# Patient Record
Sex: Female | Born: 1985 | Hispanic: No | Marital: Married | Smoking: Former smoker
Health system: Southern US, Community
[De-identification: ages and names within clinical notes are randomized; demographics above are authoritative.]

## PROBLEM LIST (undated history)

## (undated) ENCOUNTER — Inpatient Hospital Stay (HOSPITAL_COMMUNITY): Payer: Self-pay

## (undated) DIAGNOSIS — L0390 Cellulitis, unspecified: Secondary | ICD-10-CM

## (undated) DIAGNOSIS — R87629 Unspecified abnormal cytological findings in specimens from vagina: Secondary | ICD-10-CM

## (undated) DIAGNOSIS — D649 Anemia, unspecified: Secondary | ICD-10-CM

## (undated) DIAGNOSIS — B009 Herpesviral infection, unspecified: Secondary | ICD-10-CM

## (undated) DIAGNOSIS — E049 Nontoxic goiter, unspecified: Secondary | ICD-10-CM

## (undated) DIAGNOSIS — IMO0002 Reserved for concepts with insufficient information to code with codable children: Secondary | ICD-10-CM

## (undated) HISTORY — DX: Unspecified abnormal cytological findings in specimens from vagina: R87.629

## (undated) HISTORY — DX: Nontoxic goiter, unspecified: E04.9

## (undated) HISTORY — DX: Anemia, unspecified: D64.9

## (undated) HISTORY — DX: Reserved for concepts with insufficient information to code with codable children: IMO0002

## (undated) HISTORY — DX: Herpesviral infection, unspecified: B00.9

---

## 2010-11-29 ENCOUNTER — Inpatient Hospital Stay (INDEPENDENT_AMBULATORY_CARE_PROVIDER_SITE_OTHER)
Admission: RE | Admit: 2010-11-29 | Discharge: 2010-11-29 | Disposition: A | Discharge status: Home / Self Care | Payer: Self-pay | Source: Ambulatory Visit | Attending: Emergency Medicine | Admitting: Emergency Medicine

## 2010-11-29 DIAGNOSIS — A549 Gonococcal infection, unspecified: Secondary | ICD-10-CM

## 2010-11-29 LAB — POCT URINALYSIS DIP (DEVICE)
Protein, ur: NEGATIVE mg/dL
Specific Gravity, Urine: 1.025 (ref 1.005–1.030)
Urobilinogen, UA: 1 mg/dL (ref 0.0–1.0)

## 2013-10-20 LAB — OB RESULTS CONSOLE RPR: RPR: NONREACTIVE

## 2013-10-20 LAB — OB RESULTS CONSOLE GC/CHLAMYDIA
Chlamydia: NEGATIVE
GC PROBE AMP, GENITAL: NEGATIVE

## 2013-10-20 LAB — OB RESULTS CONSOLE ABO/RH: RH Type: POSITIVE

## 2013-10-20 LAB — OB RESULTS CONSOLE RUBELLA ANTIBODY, IGM: RUBELLA: IMMUNE

## 2013-10-20 LAB — OB RESULTS CONSOLE HEPATITIS B SURFACE ANTIGEN: HEP B S AG: NEGATIVE

## 2013-10-20 LAB — OB RESULTS CONSOLE HIV ANTIBODY (ROUTINE TESTING): HIV: NONREACTIVE

## 2013-10-20 LAB — OB RESULTS CONSOLE ANTIBODY SCREEN: Antibody Screen: NEGATIVE

## 2013-11-18 ENCOUNTER — Encounter (HOSPITAL_COMMUNITY): Payer: Self-pay | Admitting: *Deleted

## 2013-11-18 ENCOUNTER — Inpatient Hospital Stay (HOSPITAL_COMMUNITY)
Admission: AD | Admit: 2013-11-18 | Discharge: 2013-11-19 | Disposition: A | Discharge status: Home / Self Care | Payer: Medicaid Other | Source: Ambulatory Visit | Attending: Obstetrics & Gynecology | Admitting: Obstetrics & Gynecology

## 2013-11-18 DIAGNOSIS — Z5320 Procedure and treatment not carried out because of patient's decision for unspecified reasons: Secondary | ICD-10-CM | POA: Insufficient documentation

## 2013-11-19 DIAGNOSIS — Z5320 Procedure and treatment not carried out because of patient's decision for unspecified reasons: Secondary | ICD-10-CM | POA: Diagnosis not present

## 2013-11-19 NOTE — MAU Note (Signed)
Not in lobby

## 2013-11-19 NOTE — MAU Note (Signed)
NOT IN LOBBY 

## 2013-11-23 LAB — OB RESULTS CONSOLE GBS: GBS: POSITIVE

## 2014-01-12 ENCOUNTER — Encounter (HOSPITAL_COMMUNITY): Payer: Self-pay | Admitting: *Deleted

## 2014-01-25 ENCOUNTER — Encounter (HOSPITAL_COMMUNITY): Payer: Self-pay

## 2014-01-25 ENCOUNTER — Inpatient Hospital Stay (HOSPITAL_COMMUNITY)
Admission: AD | Admit: 2014-01-25 | Discharge: 2014-01-25 | Disposition: A | Discharge status: Home / Self Care | Payer: Medicaid Other | Source: Ambulatory Visit | Attending: Obstetrics and Gynecology | Admitting: Obstetrics and Gynecology

## 2014-01-25 DIAGNOSIS — E669 Obesity, unspecified: Secondary | ICD-10-CM | POA: Diagnosis present

## 2014-01-25 DIAGNOSIS — S70362A Insect bite (nonvenomous), left thigh, initial encounter: Secondary | ICD-10-CM | POA: Insufficient documentation

## 2014-01-25 DIAGNOSIS — B951 Streptococcus, group B, as the cause of diseases classified elsewhere: Secondary | ICD-10-CM | POA: Diagnosis present

## 2014-01-25 DIAGNOSIS — Z3A21 21 weeks gestation of pregnancy: Secondary | ICD-10-CM | POA: Diagnosis not present

## 2014-01-25 DIAGNOSIS — O26892 Other specified pregnancy related conditions, second trimester: Secondary | ICD-10-CM | POA: Diagnosis present

## 2014-01-25 DIAGNOSIS — R6252 Short stature (child): Secondary | ICD-10-CM

## 2014-01-25 DIAGNOSIS — L0390 Cellulitis, unspecified: Secondary | ICD-10-CM | POA: Diagnosis present

## 2014-01-25 LAB — URINALYSIS, ROUTINE W REFLEX MICROSCOPIC
BILIRUBIN URINE: NEGATIVE
Glucose, UA: NEGATIVE mg/dL
Hgb urine dipstick: NEGATIVE
KETONES UR: NEGATIVE mg/dL
LEUKOCYTES UA: NEGATIVE
NITRITE: NEGATIVE
PROTEIN: NEGATIVE mg/dL
Specific Gravity, Urine: 1.015 (ref 1.005–1.030)
UROBILINOGEN UA: 0.2 mg/dL (ref 0.0–1.0)
pH: 7 (ref 5.0–8.0)

## 2014-01-25 MED ORDER — CEPHALEXIN 500 MG PO CAPS: 500.0000 mg | ORAL_CAPSULE | Freq: Four times a day (QID) | ORAL | Status: AC

## 2014-01-25 MED ORDER — IBUPROFEN 600 MG PO TABS: 600.0000 mg | ORAL_TABLET | Freq: Four times a day (QID) | ORAL | Status: DC

## 2014-01-25 NOTE — MAU Provider Note (Signed)
History   28 yo G1P0 at 4921 3/7 weeks presented unannounced c/o "bug bites" on left thigh and left 3rd finger--noted on awakening, now more swollen.  Is unsure of when contact occurred, but did not note any specific injury.  Denies fever, myalgia, N/V, or any other issues.  No hx MRSA or other skin infections.  Patient Active Problem List   Diagnosis Date Noted  . Cellulitis 01/25/2014  . Positive GBS test 01/25/2014  . Short stature 01/25/2014  . Obese 01/25/2014    Chief Complaint  Patient presents with  . Insect Bite   HPI:  See above  OB History    Gravida Para Term Preterm AB TAB SAB Ectopic Multiple Living   1               History reviewed. No pertinent past medical history.  History reviewed. No pertinent past surgical history.  History reviewed. No pertinent family history.  History  Substance Use Topics  . Smoking status: Never Smoker   . Smokeless tobacco: Not on file  . Alcohol Use: No    Allergies:  Allergies  Allergen Reactions  . Other Hives    Cayenne Pepper    No prescriptions prior to admission    ROS:  Red spot and swelling in left upper thigh, red spot and swelling of left 3rd finger. Physical Exam   Blood pressure 138/54, pulse 85, temperature 98 F (36.7 C), temperature source Oral, resp. rate 18, last menstrual period 08/28/2013.  Physical Exam  Chest clear Heart RRR without murmur Abd gravid, NT Pelvic--deferred Ext: Left upper thigh--1 cm indurated lesion, surrounded by approx 6 cm area of cellulitis.  Mild tenderness at central lesion, cellulitis area NT. Left 3rd finger--pinpoint indurated lesion above knuckle, with finger swollen and erythematous.  Finger tender to bending, but not acutely painful. No open lesion or draining wound noted.  ED Course  Assessment: IUP at 21 3/7 weeks ? Insect bite vs skin lesions at two sites.  Plan: D/C home. Thigh lesion marked circumferentially to allow for f/u evaluation. Rx Keflex 500  mg po QID x 7 days  Rx Ibuprophen 600 mg po 6 hours x 24 hours. Warm soaks to finger and thigh at least BID. RTO on 01/27/14 at 8:30am for recheck of status. Patient to call with any worsening of sx.    Nigel BridgemanLATHAM, Tykee Heideman CNM, MSN 01/25/2014 2:07 PMthop

## 2014-01-25 NOTE — MAU Note (Addendum)
Pt presents complaining of an insect bite on her left leg and left finger. States it is sore with movement. Some swelling and redness noticed at the site. States it seems to have gotten worse over night. Denies vaginal bleeding, discharge or leaking of fluid. Reports good fetal movement.

## 2014-01-25 NOTE — Discharge Instructions (Signed)

## 2014-01-25 NOTE — Progress Notes (Signed)
Notified of pt arrival in MAU and complaint. Will come see pt 

## 2014-02-16 ENCOUNTER — Other Ambulatory Visit (HOSPITAL_COMMUNITY): Payer: Self-pay | Admitting: Family Medicine

## 2014-02-16 ENCOUNTER — Other Ambulatory Visit: Payer: Self-pay | Admitting: Obstetrics and Gynecology

## 2014-02-16 ENCOUNTER — Inpatient Hospital Stay (HOSPITAL_COMMUNITY)
Admission: AD | Admit: 2014-02-16 | Discharge: 2014-02-16 | Disposition: A | Discharge status: Home / Self Care | Payer: Medicaid Other | Source: Ambulatory Visit | Attending: Obstetrics and Gynecology | Admitting: Obstetrics and Gynecology

## 2014-02-16 DIAGNOSIS — Z3A25 25 weeks gestation of pregnancy: Secondary | ICD-10-CM | POA: Diagnosis not present

## 2014-02-16 DIAGNOSIS — R9389 Abnormal findings on diagnostic imaging of other specified body structures: Secondary | ICD-10-CM

## 2014-02-16 MED ORDER — BETAMETHASONE SOD PHOS & ACET 6 (3-3) MG/ML IJ SUSP
12.0000 mg | INTRAMUSCULAR | Status: DC
  Administered 2014-02-16: 12 mg via INTRAMUSCULAR
  Filled 2014-02-16: qty 2

## 2014-02-17 ENCOUNTER — Inpatient Hospital Stay (HOSPITAL_COMMUNITY)
Admission: AD | Admit: 2014-02-17 | Discharge: 2014-02-17 | Disposition: A | Discharge status: Home / Self Care | Payer: Medicaid Other | Source: Ambulatory Visit | Attending: Obstetrics and Gynecology | Admitting: Obstetrics and Gynecology

## 2014-02-17 DIAGNOSIS — Z3A25 25 weeks gestation of pregnancy: Secondary | ICD-10-CM | POA: Diagnosis not present

## 2014-02-17 MED ORDER — BETAMETHASONE SOD PHOS & ACET 6 (3-3) MG/ML IJ SUSP
12.0000 mg | Freq: Once | INTRAMUSCULAR | Status: AC
  Administered 2014-02-17: 12 mg via INTRAMUSCULAR
  Filled 2014-02-17: qty 2

## 2014-02-17 NOTE — MAU Note (Signed)
Here for 2nd betamethasone injection.  Ice pack to LUOQ for pt comfort. (has needle issues)

## 2014-02-17 NOTE — MAU Note (Signed)
No complaints, tolerated injection

## 2014-02-20 ENCOUNTER — Ambulatory Visit (HOSPITAL_COMMUNITY)
Admit: 2014-02-20 | Discharge: 2014-02-20 | Disposition: A | Discharge status: Home / Self Care | Payer: Medicaid Other | Attending: Family Medicine | Admitting: Family Medicine

## 2014-02-20 ENCOUNTER — Ambulatory Visit (HOSPITAL_COMMUNITY): Admit: 2014-02-20 | Payer: Medicaid Other

## 2014-02-20 ENCOUNTER — Other Ambulatory Visit (HOSPITAL_COMMUNITY): Payer: Self-pay

## 2014-02-20 ENCOUNTER — Encounter (HOSPITAL_COMMUNITY): Payer: Self-pay

## 2014-02-20 ENCOUNTER — Other Ambulatory Visit (HOSPITAL_COMMUNITY): Payer: Self-pay | Admitting: Obstetrics and Gynecology

## 2014-02-20 ENCOUNTER — Other Ambulatory Visit (HOSPITAL_COMMUNITY): Payer: Self-pay | Admitting: Family Medicine

## 2014-02-20 DIAGNOSIS — O26872 Cervical shortening, second trimester: Secondary | ICD-10-CM

## 2014-02-20 DIAGNOSIS — O365920 Maternal care for other known or suspected poor fetal growth, second trimester, not applicable or unspecified: Secondary | ICD-10-CM | POA: Diagnosis present

## 2014-02-20 DIAGNOSIS — R9389 Abnormal findings on diagnostic imaging of other specified body structures: Secondary | ICD-10-CM

## 2014-02-20 DIAGNOSIS — O358XX0 Maternal care for other (suspected) fetal abnormality and damage, not applicable or unspecified: Secondary | ICD-10-CM | POA: Diagnosis not present

## 2014-02-20 DIAGNOSIS — Z3A25 25 weeks gestation of pregnancy: Secondary | ICD-10-CM

## 2014-02-20 DIAGNOSIS — IMO0002 Reserved for concepts with insufficient information to code with codable children: Secondary | ICD-10-CM

## 2014-02-20 DIAGNOSIS — Z36 Encounter for antenatal screening of mother: Secondary | ICD-10-CM | POA: Insufficient documentation

## 2014-02-20 DIAGNOSIS — Z3689 Encounter for other specified antenatal screening: Secondary | ICD-10-CM

## 2014-02-20 NOTE — Consult Note (Signed)
MFM consult    28 yr old G2P0010 at 3965w1d referred by Dr. Normand Sloopillard for fetal ultrasound and consult secondary to poor fetal growth and short cervix seen on outside ultrasound.  Ultrasound today shows: single intrauterine pregnancy. Estimated fetal weight is in the 21st%; the abdominal circumference is in the <3rd%. Anterior placenta without evidence of previa. Normal amniotic fluid volume. Shortened transvaginal cervical length of 1.7cm. The views of the face, palate, hands, and ductal arch are limited. The remainder of the limited anatomy survey is normal. Elevated S/D ratio on umbilical artery Doppler studies; there is persistent forward flow.  I counseled the patient as follows:  1. Fetal growth restriction with abdominal circumference <3rd%: - counseled on possible etiologies- placental insufficiency, infection, genetic abnormality (patient did not have aneuploidy screening) - discussed increased risk of stillbirth, need for hospitalization, need for early delivery - patient has already had a course of betamethasone on 12/7 and 12/8 - recommend repeat Doppler studies on 12/14; if stable may follow with weekly Doppler studies or more often as clinically indicated - start antenatal testing at 28 weeks with weekly BPPs; can switch to twice weekly NSTs at 32 weeks with weekly AFI - delivery timing based on clinical scenario - recommend start fetal kick counts at 28 weeks 2. Normal limited anatomy survey 3. Short cervix: - discussed increased risk of preterm labor/PPROM/ preterm delivery - patient has already been started on vaginal progesterone; recommend continue until 36 weeks - patient has already received a course of betamethasone - patient is asymptomatic - repeat cervical length on 12/14; if there is further deterioration or dilation may recommend admission to monitor for preterm labor - preterm labor precautions reviewed   I spent a total of 40 minutes with the patient of which >50%  was in face to face consultation. Please call with questions.  Eulis FosterKristen Delmar Arriaga, MD

## 2014-02-20 NOTE — Progress Notes (Signed)
[facility] ultrasound   Indication: 28 yr old G2P0010 at 2162w1d referred for fetal ultrasound secondary to poor fetal growth seen on outside ultrasound.  Findings: 1. Single intrauterine pregnancy. 2. Estimated fetal weight is in the 21st%; the abdominal circumference is in the <3rd%. 3. Anterior placenta without evidence of previa. 4. Normal amniotic fluid volume. 5. Shortened transvaginal cervical length of 1.7cm. 6. The views of the face, palate, hands, and ductal arch are limited. 7. The remainder of the limited anatomy survey is normal. 8. Elevated S/D ratio on umbilical artery Doppler studies; there is persistent forward flow.  Recommendations: 1. Fetal growth restriction with abdominal circumference <3rd%: - counseled on possible etiologies- placental insufficiency, infection, genetic abnormality (patient did not have aneuploidy screening) - discussed increased risk of stillbirth, need for hospitalization, need for early delivery - patient has already had a course of betamethasone on 12/7 and 12/8 - recommend repeat Doppler studies on 12/14; if stable may follow with weekly Doppler studies or more often as clinically indicated - start antenatal testing at 28 weeks with weekly BPPs; can switch to twice weekly NSTs at 32 weeks with weekly AFI - delivery timing based on clinical scenario - recommend start fetal kick counts at 28 weeks 2. Normal limited anatomy survey 3. Short cervix: - discussed increased risk of preterm labor/PPROM/ preterm delivery - patient has already been started on vaginal progesterone; recommend continue until 36 weeks - patient has already received a course of betamethasone - patient is asymptomatic - repeat cervical length on 12/14; if there is further deterioration or dilation may recommend admission to monitor for preterm labor - preterm labor precautions reviewed  Eulis FosterKristen Stavroula Rohde, MD

## 2014-02-23 ENCOUNTER — Inpatient Hospital Stay (HOSPITAL_COMMUNITY): Admission: RE | Admit: 2014-02-23 | Payer: Medicaid Other | Source: Ambulatory Visit

## 2014-03-10 ENCOUNTER — Other Ambulatory Visit (HOSPITAL_COMMUNITY): Payer: Self-pay | Admitting: Family Medicine

## 2014-03-10 DIAGNOSIS — IMO0002 Reserved for concepts with insufficient information to code with codable children: Secondary | ICD-10-CM

## 2014-03-13 NOTE — L&D Delivery Note (Signed)
Delivery Note At 1:28 PM a viable female, "Wendy Fernandez", was delivered via Vaginal, Spontaneous Delivery (Presentation: LOA).  APGAR: 9, 9; weight  .   Placenta status: Intact, Spontaneous.  Cord: 3 vessels with the following complications: None.  Cord pH: NA  Anesthesia: Epidural  Episiotomy: None Lacerations: Periurethral;1st degree--repaired with red robinson cath in place for urethral localization; left vaginal sidewall laceration, repaired for hemostasis. Suture Repair: 3.0 vicryl Est. Blood Loss (mL): 250  Mom to postpartum.  Baby to Couplet care / Skin to Skin. Placenta to path due to IUGR and abnormal dopplers.  Jayani Rozman 05/16/2014, 2:04 PM

## 2014-03-17 ENCOUNTER — Ambulatory Visit (HOSPITAL_COMMUNITY): Payer: Medicaid Other

## 2014-03-23 ENCOUNTER — Ambulatory Visit (HOSPITAL_COMMUNITY): Payer: Medicaid Other | Attending: Family Medicine

## 2014-03-30 ENCOUNTER — Other Ambulatory Visit (HOSPITAL_COMMUNITY): Payer: Self-pay | Admitting: Obstetrics and Gynecology

## 2014-03-30 DIAGNOSIS — O359XX0 Maternal care for (suspected) fetal abnormality and damage, unspecified, not applicable or unspecified: Secondary | ICD-10-CM

## 2014-03-30 DIAGNOSIS — O365920 Maternal care for other known or suspected poor fetal growth, second trimester, not applicable or unspecified: Secondary | ICD-10-CM

## 2014-04-13 ENCOUNTER — Encounter (HOSPITAL_COMMUNITY): Payer: Self-pay | Admitting: *Deleted

## 2014-04-13 ENCOUNTER — Inpatient Hospital Stay (HOSPITAL_COMMUNITY): Payer: Medicaid Other

## 2014-04-13 ENCOUNTER — Ambulatory Visit (HOSPITAL_COMMUNITY): Admission: RE | Admit: 2014-04-13 | Payer: Medicaid Other | Source: Ambulatory Visit

## 2014-04-13 ENCOUNTER — Inpatient Hospital Stay (HOSPITAL_COMMUNITY)
Admission: AD | Admit: 2014-04-13 | Discharge: 2014-04-13 | Disposition: A | Discharge status: Home / Self Care | Payer: Medicaid Other | Source: Ambulatory Visit | Attending: Obstetrics & Gynecology | Admitting: Obstetrics & Gynecology

## 2014-04-13 DIAGNOSIS — Z3A32 32 weeks gestation of pregnancy: Secondary | ICD-10-CM | POA: Insufficient documentation

## 2014-04-13 DIAGNOSIS — O365933 Maternal care for other known or suspected poor fetal growth, third trimester, fetus 3: Secondary | ICD-10-CM

## 2014-04-13 DIAGNOSIS — O365930 Maternal care for other known or suspected poor fetal growth, third trimester, not applicable or unspecified: Secondary | ICD-10-CM | POA: Insufficient documentation

## 2014-04-13 HISTORY — DX: Cellulitis, unspecified: L03.90

## 2014-04-13 NOTE — MAU Note (Signed)
Pt presents from the office for prolonged fetal monitoring. Denies pain, bleeding, or leaking of fluid. Reports good fetal movement.

## 2014-04-13 NOTE — Progress Notes (Signed)
MAU Addendum Note FHR 145, moderate variability, + accel no decel, occasional ctx w/uterine irribility  US results: AFI 14.48, BPP 8/8, FHR 136  Plan: Per Dr Sallye OberKulwa DC to home with instruction for fetal surveillance twice a week.  -Discussed need to follow up in office Friday at 0900 for NST & BPP  -pt to keep her MFM appointment on Monday  -Bleeding and PTL Precautions -Kick counts -Encouraged to call if any questions or concerns arise prior to next scheduled office visit.  -Discharged to home in stable condition Consulted with Dr. Burna FortsKulwa    Wendy Fernandez, CNM, MSN 04/13/2014. 2:25 PM

## 2014-04-13 NOTE — MAU Provider Note (Signed)
Wendy Fernandez is a 29 y.o. G2P0010 at 32.4 weeks.  Pt arrived from the office.  The office reports that  03/30/14 PATIENT DID NOT SHOW FOR MFM APPOINTMENT, IMPORTANCE OF MFM FOLLOW UP DISCUSSED, SHE DECLINED ANOTHER MFM APPOINTMENT TODAY STATING SHE WILL CALL MFM TO MAKE HER OWN APPOINTMENT AT HER CONVENIENCE, STATING SHE HAS A LOT ON HER PLATE TO TAKE CARE OF FIRST. -US TODAY EFW 1391 G (10TH%) (BPD 3%, HC <2%, AC 24%, FL 7%), AFI 13, SD RATIO 2.85 (NORMAL), BPP 8/8. CL 1.8 TO 2.3 CM. 04/13/14-Growth U/S and BPP reviewed: EFW 1514g (2%) decreased from previous scan 10%.  Interval growth remains stable but less than 2%, BPP 6/8 (NO BREATHING), AFI 14.3cm.  UAD: S/D elevated at 3.4.  We reviewed her reason for declining the MFM visit.  She is willing to go if she will be assigned a different nurse.  History     Patient Active Problem List   Diagnosis Date Noted  . Cellulitis 01/25/2014  . Positive GBS test 01/25/2014  . Short stature 01/25/2014  . Obese 01/25/2014    Chief Complaint  Patient presents with  . Prolonged Monitoring    HPI  OB History    Gravida Para Term Preterm AB TAB SAB Ectopic Multiple Living   2 0 0 0 1 1  0 0 0      Past Medical History  Diagnosis Date  . Cellulitis     History reviewed. No pertinent past surgical history.  History reviewed. No pertinent family history.  History  Substance Use Topics  . Smoking status: Never Smoker   . Smokeless tobacco: Not on file  . Alcohol Use: No    Allergies:  Allergies  Allergen Reactions  . Other Hives    Cayenne Pepper    Prescriptions prior to admission  Medication Sig Dispense Refill Last Dose  . Prenatal Vit-Fe Fumarate-FA (PRENATAL MULTIVITAMIN) TABS tablet Take 1 tablet by mouth daily at 12 noon.   04/13/2014 at Unknown time  . progesterone 200 MG SUPP Place 200 mg vaginally at bedtime.   04/12/2014 at Unknown time  . ibuprofen (ADVIL,MOTRIN) 600 MG tablet Take 1 tablet (600 mg total) by mouth every 6  (six) hours. For 24 hours. (Patient not taking: Reported on 04/13/2014) 30 tablet 0     ROS See HPI above, all other systems are negative  Physical Exam   Blood pressure 119/62, pulse 116, temperature 98 F (36.7 C), temperature source Oral, resp. rate 18, last menstrual period 08/28/2013.  Physical Exam Ext:  WNL ABD: Soft, non tender to palpation, no rebound or guarding SVE: deferred   ED Course  Assessment: IUP at  32.4weeks Membranes: intact FHR: Category 1 CTX:  none With the patient we spoke to MFM and she is will in to see them again.    Plan: NST 6 hour of observation BPP FU at MFM on Monday    Venus Standard, CNM, MSN 04/13/2014. 1:43 PM  NST was category 1 and reactive from the beginning, patient did not require the full 6 hour NST.  Dr. Sallye OberKulwa.

## 2014-04-13 NOTE — MAU Note (Signed)
Urine in lab 

## 2014-04-13 NOTE — Discharge Instructions (Signed)
Preterm Labor Information °Preterm labor is when labor starts at less than 37 weeks of pregnancy. The normal length of a pregnancy is 39 to 41 weeks. °CAUSES °Often, there is no identifiable underlying cause as to why a woman goes into preterm labor. One of the most common known causes of preterm labor is infection. Infections of the uterus, cervix, vagina, amniotic sac, bladder, kidney, or even the lungs (pneumonia) can cause labor to start. Other suspected causes of preterm labor include:  °· Urogenital infections, such as yeast infections and bacterial vaginosis.   °· Uterine abnormalities (uterine shape, uterine septum, fibroids, or bleeding from the placenta).   °· A cervix that has been operated on (it may fail to stay closed).   °· Malformations in the fetus.   °· Multiple gestations (twins, triplets, and so on).   °· Breakage of the amniotic sac.   °RISK FACTORS °· Having a previous history of preterm labor.   °· Having premature rupture of membranes (PROM).   °· Having a placenta that covers the opening of the cervix (placenta previa).   °· Having a placenta that separates from the uterus (placental abruption).   °· Having a cervix that is too weak to hold the fetus in the uterus (incompetent cervix).   °· Having too much fluid in the amniotic sac (polyhydramnios).   °· Taking illegal drugs or smoking while pregnant.   °· Not gaining enough weight while pregnant.   °· Being younger than 18 and older than 27 years old.   °· Having a low socioeconomic status.   °· Being African American. °SYMPTOMS °Signs and symptoms of preterm labor include:  °· Menstrual-like cramps, abdominal pain, or back pain. °· Uterine contractions that are regular, as frequent as six in an hour, regardless of their intensity (may be mild or painful). °· Contractions that start on the top of the uterus and spread down to the lower abdomen and back.   °· A sense of increased pelvic pressure.   °· A watery or bloody mucus discharge that  comes from the vagina.   °TREATMENT °Depending on the length of the pregnancy and other circumstances, your health care provider may suggest bed rest. If necessary, there are medicines that can be given to stop contractions and to mature the fetal lungs. If labor happens before 34 weeks of pregnancy, a prolonged hospital stay may be recommended. Treatment depends on the condition of both you and the fetus.  °WHAT SHOULD YOU DO IF YOU THINK YOU ARE IN PRETERM LABOR? °Call your health care provider right away. You will need to go to the hospital to get checked immediately. °HOW CAN YOU PREVENT PRETERM LABOR IN FUTURE PREGNANCIES? °You should:  °· Stop smoking if you smoke.  °· Maintain healthy weight gain and avoid chemicals and drugs that are not necessary. °· Be watchful for any type of infection. °· Inform your health care provider if you have a known history of preterm labor. °Document Released: 05/20/2003 Document Revised: 10/30/2012 Document Reviewed: 04/01/2012 °[employer]® Patient Information ©2015 [employer]. This information is not intended to replace advice given to you by your health care provider. Make sure you discuss any questions you have with your health care provider. °Fetal Movement Counts °Patient Name: __________________________________________________ Patient Due Date: ____________________ °Performing a fetal movement count is highly recommended in high-risk pregnancies, but it is good for every pregnant woman to do. Your health care provider may ask you to start counting fetal movements at 28 weeks of the pregnancy. Fetal movements often increase: °· After eating a full meal. °· After physical activity. °· After   eating or drinking something sweet or cold. °· At rest. °Pay attention to when you feel the baby is most active. This will help you notice a pattern of your baby's sleep and wake cycles and what factors contribute to an increase in fetal movement. It is important to perform a fetal  movement count at the same time each day when your baby is normally most active.  °HOW TO COUNT FETAL MOVEMENTS °· Find a quiet and comfortable area to sit or lie down on your left side. Lying on your left side provides the best blood and oxygen circulation to your baby. °· Write down the day and time on a sheet of paper or in a journal. °· Start counting kicks, flutters, swishes, rolls, or jabs in a 2-hour period. You should feel at least 10 movements within 2 hours. °· If you do not feel 10 movements in 2 hours, wait 2-3 hours and count again. Look for a change in the pattern or not enough counts in 2 hours. °SEEK MEDICAL CARE IF: °· You feel less than 10 counts in 2 hours, tried twice. °· There is no movement in over an hour. °· The pattern is changing or taking longer each day to reach 10 counts in 2 hours. °· You feel the baby is not moving as he or she usually does. °Date: ____________ Movements: ____________ Start time: ____________ Finish time: ____________  °Date: ____________ Movements: ____________ Start time: ____________ Finish time: ____________ °Date: ____________ Movements: ____________ Start time: ____________ Finish time: ____________ °Date: ____________ Movements: ____________ Start time: ____________ Finish time: ____________ °Date: ____________ Movements: ____________ Start time: ____________ Finish time: ____________ °Date: ____________ Movements: ____________ Start time: ____________ Finish time: ____________ °Date: ____________ Movements: ____________ Start time: ____________ Finish time: ____________ °Date: ____________ Movements: ____________ Start time: ____________ Finish time: ____________  °Date: ____________ Movements: ____________ Start time: ____________ Finish time: ____________ °Date: ____________ Movements: ____________ Start time: ____________ Finish time: ____________ °Date: ____________ Movements: ____________ Start time: ____________ Finish time: ____________ °Date:  ____________ Movements: ____________ Start time: ____________ Finish time: ____________ °Date: ____________ Movements: ____________ Start time: ____________ Finish time: ____________ °Date: ____________ Movements: ____________ Start time: ____________ Finish time: ____________ °Date: ____________ Movements: ____________ Start time: ____________ Finish time: ____________  °Date: ____________ Movements: ____________ Start time: ____________ Finish time: ____________ °Date: ____________ Movements: ____________ Start time: ____________ Finish time: ____________ °Date: ____________ Movements: ____________ Start time: ____________ Finish time: ____________ °Date: ____________ Movements: ____________ Start time: ____________ Finish time: ____________ °Date: ____________ Movements: ____________ Start time: ____________ Finish time: ____________ °Date: ____________ Movements: ____________ Start time: ____________ Finish time: ____________ °Date: ____________ Movements: ____________ Start time: ____________ Finish time: ____________  °Date: ____________ Movements: ____________ Start time: ____________ Finish time: ____________ °Date: ____________ Movements: ____________ Start time: ____________ Finish time: ____________ °Date: ____________ Movements: ____________ Start time: ____________ Finish time: ____________ °Date: ____________ Movements: ____________ Start time: ____________ Finish time: ____________ °Date: ____________ Movements: ____________ Start time: ____________ Finish time: ____________ °Date: ____________ Movements: ____________ Start time: ____________ Finish time: ____________ °Date: ____________ Movements: ____________ Start time: ____________ Finish time: ____________  °Date: ____________ Movements: ____________ Start time: ____________ Finish time: ____________ °Date: ____________ Movements: ____________ Start time: ____________ Finish time: ____________ °Date: ____________ Movements: ____________ Start  time: ____________ Finish time: ____________ °Date: ____________ Movements: ____________ Start time: ____________ Finish time: ____________ °Date: ____________ Movements: ____________ Start time: ____________ Finish time: ____________ °Date: ____________ Movements: ____________ Start time: ____________ Finish time: ____________ °Date: ____________ Movements: ____________ Start time: ____________ Finish time: ____________  °Date:   ____________ Movements: ____________ Start time: ____________ Finish time: ____________ °Date: ____________ Movements: ____________ Start time: ____________ Finish time: ____________ °Date: ____________ Movements: ____________ Start time: ____________ Finish time: ____________ °Date: ____________ Movements: ____________ Start time: ____________ Finish time: ____________ °Date: ____________ Movements: ____________ Start time: ____________ Finish time: ____________ °Date: ____________ Movements: ____________ Start time: ____________ Finish time: ____________ °Date: ____________ Movements: ____________ Start time: ____________ Finish time: ____________  °Date: ____________ Movements: ____________ Start time: ____________ Finish time: ____________ °Date: ____________ Movements: ____________ Start time: ____________ Finish time: ____________ °Date: ____________ Movements: ____________ Start time: ____________ Finish time: ____________ °Date: ____________ Movements: ____________ Start time: ____________ Finish time: ____________ °Date: ____________ Movements: ____________ Start time: ____________ Finish time: ____________ °Date: ____________ Movements: ____________ Start time: ____________ Finish time: ____________ °Date: ____________ Movements: ____________ Start time: ____________ Finish time: ____________  °Date: ____________ Movements: ____________ Start time: ____________ Finish time: ____________ °Date: ____________ Movements: ____________ Start time: ____________ Finish time:  ____________ °Date: ____________ Movements: ____________ Start time: ____________ Finish time: ____________ °Date: ____________ Movements: ____________ Start time: ____________ Finish time: ____________ °Date: ____________ Movements: ____________ Start time: ____________ Finish time: ____________ °Date: ____________ Movements: ____________ Start time: ____________ Finish time: ____________ °Document Released: 03/29/2006 Document Revised: 07/14/2013 Document Reviewed: 12/25/2011 °[employer]® Patient Information ©2015 [employer]. This information is not intended to replace advice given to you by your health care provider. Make sure you discuss any questions you have with your health care provider. ° °

## 2014-04-13 NOTE — MAU Note (Signed)
States she has been monitored weekly since 19 weeks due to baby small for dates. States today on BPP, there were no breathing movements. Sent for prolonged EFM.

## 2014-04-20 ENCOUNTER — Ambulatory Visit (HOSPITAL_COMMUNITY): Payer: Medicaid Other | Attending: Family Medicine

## 2014-05-01 ENCOUNTER — Ambulatory Visit (HOSPITAL_COMMUNITY)
Admission: RE | Admit: 2014-05-01 | Discharge: 2014-05-01 | Disposition: A | Discharge status: Home / Self Care | Payer: Medicaid Other | Source: Ambulatory Visit | Attending: Obstetrics and Gynecology | Admitting: Obstetrics and Gynecology

## 2014-05-01 ENCOUNTER — Other Ambulatory Visit (HOSPITAL_COMMUNITY): Payer: Self-pay | Admitting: Obstetrics and Gynecology

## 2014-05-01 ENCOUNTER — Ambulatory Visit (HOSPITAL_COMMUNITY): Payer: Medicaid Other

## 2014-05-01 ENCOUNTER — Encounter (HOSPITAL_COMMUNITY): Payer: Self-pay

## 2014-05-01 DIAGNOSIS — O365930 Maternal care for other known or suspected poor fetal growth, third trimester, not applicable or unspecified: Secondary | ICD-10-CM | POA: Insufficient documentation

## 2014-05-01 DIAGNOSIS — Z3A35 35 weeks gestation of pregnancy: Secondary | ICD-10-CM | POA: Insufficient documentation

## 2014-05-01 DIAGNOSIS — O365920 Maternal care for other known or suspected poor fetal growth, second trimester, not applicable or unspecified: Secondary | ICD-10-CM

## 2014-05-01 DIAGNOSIS — O359XX0 Maternal care for (suspected) fetal abnormality and damage, unspecified, not applicable or unspecified: Secondary | ICD-10-CM

## 2014-05-01 DIAGNOSIS — O358XX0 Maternal care for other (suspected) fetal abnormality and damage, not applicable or unspecified: Secondary | ICD-10-CM | POA: Diagnosis present

## 2014-05-01 DIAGNOSIS — O99213 Obesity complicating pregnancy, third trimester: Secondary | ICD-10-CM | POA: Insufficient documentation

## 2014-05-13 ENCOUNTER — Telehealth (HOSPITAL_COMMUNITY): Payer: Self-pay | Admitting: *Deleted

## 2014-05-13 ENCOUNTER — Encounter (HOSPITAL_COMMUNITY): Payer: Self-pay | Admitting: *Deleted

## 2014-05-13 NOTE — Telephone Encounter (Signed)
Preadmission screen  

## 2014-05-14 ENCOUNTER — Inpatient Hospital Stay (HOSPITAL_COMMUNITY)
Admission: RE | Admit: 2014-05-14 | Discharge: 2014-05-18 | DRG: 774 | Disposition: A | Discharge status: Home / Self Care | Payer: Medicaid Other | Source: Ambulatory Visit | Attending: Obstetrics & Gynecology | Admitting: Obstetrics & Gynecology

## 2014-05-14 DIAGNOSIS — O99824 Streptococcus B carrier state complicating childbirth: Secondary | ICD-10-CM | POA: Diagnosis present

## 2014-05-14 DIAGNOSIS — Z833 Family history of diabetes mellitus: Secondary | ICD-10-CM

## 2014-05-14 DIAGNOSIS — O26873 Cervical shortening, third trimester: Secondary | ICD-10-CM | POA: Diagnosis present

## 2014-05-14 DIAGNOSIS — IMO0002 Reserved for concepts with insufficient information to code with codable children: Secondary | ICD-10-CM | POA: Diagnosis present

## 2014-05-14 DIAGNOSIS — R7309 Other abnormal glucose: Secondary | ICD-10-CM | POA: Diagnosis present

## 2014-05-14 DIAGNOSIS — O9832 Other infections with a predominantly sexual mode of transmission complicating childbirth: Secondary | ICD-10-CM | POA: Diagnosis present

## 2014-05-14 DIAGNOSIS — O99214 Obesity complicating childbirth: Secondary | ICD-10-CM | POA: Diagnosis present

## 2014-05-14 DIAGNOSIS — R768 Other specified abnormal immunological findings in serum: Secondary | ICD-10-CM | POA: Diagnosis present

## 2014-05-14 DIAGNOSIS — A6000 Herpesviral infection of urogenital system, unspecified: Secondary | ICD-10-CM | POA: Diagnosis present

## 2014-05-14 DIAGNOSIS — O365930 Maternal care for other known or suspected poor fetal growth, third trimester, not applicable or unspecified: Secondary | ICD-10-CM | POA: Diagnosis present

## 2014-05-14 DIAGNOSIS — Z825 Family history of asthma and other chronic lower respiratory diseases: Secondary | ICD-10-CM

## 2014-05-14 DIAGNOSIS — N883 Incompetence of cervix uteri: Secondary | ICD-10-CM | POA: Diagnosis not present

## 2014-05-14 DIAGNOSIS — Z3A37 37 weeks gestation of pregnancy: Secondary | ICD-10-CM | POA: Diagnosis present

## 2014-05-14 DIAGNOSIS — Z8249 Family history of ischemic heart disease and other diseases of the circulatory system: Secondary | ICD-10-CM

## 2014-05-14 DIAGNOSIS — O24429 Gestational diabetes mellitus in childbirth, unspecified control: Secondary | ICD-10-CM | POA: Diagnosis present

## 2014-05-14 DIAGNOSIS — Z6841 Body Mass Index (BMI) 40.0 and over, adult: Secondary | ICD-10-CM | POA: Diagnosis not present

## 2014-05-14 DIAGNOSIS — Z87891 Personal history of nicotine dependence: Secondary | ICD-10-CM | POA: Diagnosis not present

## 2014-05-14 LAB — CBC
HCT: 34 % — ABNORMAL LOW (ref 36.0–46.0)
Hemoglobin: 11.1 g/dL — ABNORMAL LOW (ref 12.0–15.0)
MCH: 29.4 pg (ref 26.0–34.0)
MCHC: 32.6 g/dL (ref 30.0–36.0)
MCV: 90.2 fL (ref 78.0–100.0)
Platelets: 288 10*3/uL (ref 150–400)
RBC: 3.77 MIL/uL — ABNORMAL LOW (ref 3.87–5.11)
RDW: 14.3 % (ref 11.5–15.5)
WBC: 10.2 10*3/uL (ref 4.0–10.5)

## 2014-05-14 LAB — COMPREHENSIVE METABOLIC PANEL
ALBUMIN: 2.9 g/dL — AB (ref 3.5–5.2)
ALK PHOS: 138 U/L — AB (ref 39–117)
ALT: 23 U/L (ref 0–35)
AST: 21 U/L (ref 0–37)
Anion gap: 10 (ref 5–15)
BUN: 12 mg/dL (ref 6–23)
CO2: 20 mmol/L (ref 19–32)
Calcium: 9.1 mg/dL (ref 8.4–10.5)
Chloride: 106 mmol/L (ref 96–112)
Creatinine, Ser: 0.49 mg/dL — ABNORMAL LOW (ref 0.50–1.10)
GFR calc Af Amer: >90 mL/min (ref >90)
GFR calc non Af Amer: >90 mL/min (ref >90)
Glucose, Bld: 161 mg/dL — ABNORMAL HIGH (ref 70–99)
POTASSIUM: 3.8 mmol/L (ref 3.5–5.1)
Sodium: 136 mmol/L (ref 135–145)
Total Bilirubin: 0.4 mg/dL (ref 0.3–1.2)
Total Protein: 6.1 g/dL (ref 6.0–8.3)

## 2014-05-14 LAB — LACTATE DEHYDROGENASE: LDH: 122 U/L (ref 94–250)

## 2014-05-14 LAB — TYPE AND SCREEN
ABO/RH(D): A POS
ANTIBODY SCREEN: NEGATIVE

## 2014-05-14 LAB — URIC ACID: Uric Acid, Serum: 4.8 mg/dL (ref 2.4–7.0)

## 2014-05-14 MED ORDER — OXYTOCIN 40 UNITS IN LACTATED RINGERS INFUSION - SIMPLE MED
1.0000 m[IU]/min | INTRAVENOUS | Status: DC
  Filled 2014-05-14: qty 1000

## 2014-05-14 MED ORDER — OXYTOCIN BOLUS FROM INFUSION: 500.0000 mL | INTRAVENOUS | Status: DC

## 2014-05-14 MED ORDER — ONDANSETRON HCL 4 MG/2ML IJ SOLN: 4.0000 mg | Freq: Four times a day (QID) | INTRAMUSCULAR | Status: DC | PRN

## 2014-05-14 MED ORDER — ZOLPIDEM TARTRATE 5 MG PO TABS: 5.0000 mg | ORAL_TABLET | Freq: Every evening | ORAL | Status: DC | PRN

## 2014-05-14 MED ORDER — FLEET ENEMA 7-19 GM/118ML RE ENEM: 1.0000 | ENEMA | RECTAL | Status: DC | PRN

## 2014-05-14 MED ORDER — DEXTROSE 5 % IV SOLN
2.5000 10*6.[IU] | INTRAVENOUS | Status: DC
  Administered 2014-05-15 – 2014-05-16 (×3): 2.5 10*6.[IU] via INTRAVENOUS
  Filled 2014-05-14 (×11): qty 2.5

## 2014-05-14 MED ORDER — LACTATED RINGERS IV SOLN: 500.0000 mL | INTRAVENOUS | Status: DC | PRN

## 2014-05-14 MED ORDER — TERBUTALINE SULFATE 1 MG/ML IJ SOLN: 0.2500 mg | Freq: Once | INTRAMUSCULAR | Status: AC | PRN

## 2014-05-14 MED ORDER — ACETAMINOPHEN 325 MG PO TABS: 650.0000 mg | ORAL_TABLET | ORAL | Status: DC | PRN

## 2014-05-14 MED ORDER — CITRIC ACID-SODIUM CITRATE 334-500 MG/5ML PO SOLN: 30.0000 mL | ORAL | Status: DC | PRN

## 2014-05-14 MED ORDER — BUTORPHANOL TARTRATE 1 MG/ML IJ SOLN: 1.0000 mg | INTRAMUSCULAR | Status: DC | PRN

## 2014-05-14 MED ORDER — OXYTOCIN 40 UNITS IN LACTATED RINGERS INFUSION - SIMPLE MED: 62.5000 mL/h | INTRAVENOUS | Status: DC

## 2014-05-14 MED ORDER — OXYCODONE-ACETAMINOPHEN 5-325 MG PO TABS: 1.0000 | ORAL_TABLET | ORAL | Status: DC | PRN

## 2014-05-14 MED ORDER — LACTATED RINGERS IV SOLN
INTRAVENOUS | Status: DC
  Administered 2014-05-15 – 2014-05-16 (×4): via INTRAVENOUS

## 2014-05-14 MED ORDER — LIDOCAINE HCL (PF) 1 % IJ SOLN
30.0000 mL | INTRAMUSCULAR | Status: AC | PRN
  Administered 2014-05-16: 30 mL via SUBCUTANEOUS
  Filled 2014-05-14: qty 30

## 2014-05-14 MED ORDER — DEXTROSE 5 % IV SOLN
5.0000 10*6.[IU] | Freq: Once | INTRAVENOUS | Status: AC
  Administered 2014-05-15: 5 10*6.[IU] via INTRAVENOUS
  Filled 2014-05-14: qty 5

## 2014-05-14 MED ORDER — MISOPROSTOL 25 MCG QUARTER TABLET
25.0000 ug | ORAL_TABLET | ORAL | Status: DC | PRN
  Administered 2014-05-14 – 2014-05-15 (×3): 25 ug via VAGINAL
  Filled 2014-05-14: qty 0.25
  Filled 2014-05-14: qty 1
  Filled 2014-05-14 (×2): qty 0.25

## 2014-05-14 MED ORDER — OXYCODONE-ACETAMINOPHEN 5-325 MG PO TABS: 2.0000 | ORAL_TABLET | ORAL | Status: DC | PRN

## 2014-05-14 NOTE — Progress Notes (Signed)
Subjective: Comfortable, not aware of any UCs.  Ate pizza just before arrival.  Objective: BP 125/82 mmHg  Pulse 112  Temp(Src) 98.3 F (36.8 C) (Oral)  Resp 16  Ht 4\' 10"  (1.473 m)  Wt 253 lb (114.76 kg)  BMI 52.89 kg/m2  LMP 08/28/2013     FHT: Category 1 UC:   none SVE:   Deferred  Results for orders placed or performed during the hospital encounter of 05/14/14 (from the past 24 hour(s))  CBC     Status: Abnormal   Collection Time: 05/14/14  8:35 PM  Result Value Ref Range   WBC 10.2 4.0 - 10.5 K/uL   RBC 3.77 (L) 3.87 - 5.11 MIL/uL   Hemoglobin 11.1 (L) 12.0 - 15.0 g/dL   HCT 16.134.0 (L) 09.636.0 - 04.546.0 %   MCV 90.2 78.0 - 100.0 fL   MCH 29.4 26.0 - 34.0 pg   MCHC 32.6 30.0 - 36.0 g/dL   RDW 40.914.3 81.111.5 - 91.415.5 %   Platelets 288 150 - 400 K/uL  Comprehensive metabolic panel     Status: Abnormal   Collection Time: 05/14/14  8:35 PM  Result Value Ref Range   Sodium 136 135 - 145 mmol/L   Potassium 3.8 3.5 - 5.1 mmol/L   Chloride 106 96 - 112 mmol/L   CO2 20 19 - 32 mmol/L   Glucose, Bld 161 (H) 70 - 99 mg/dL   BUN 12 6 - 23 mg/dL   Creatinine, Ser 7.820.49 (L) 0.50 - 1.10 mg/dL   Calcium 9.1 8.4 - 95.610.5 mg/dL   Total Protein 6.1 6.0 - 8.3 g/dL   Albumin 2.9 (L) 3.5 - 5.2 g/dL   AST 21 0 - 37 U/L   ALT 23 0 - 35 U/L   Alkaline Phosphatase 138 (H) 39 - 117 U/L   Total Bilirubin 0.4 0.3 - 1.2 mg/dL   GFR calc non Af Amer >90 >90 mL/min   GFR calc Af Amer >90 >90 mL/min   Anion gap 10 5 - 15  Lactate dehydrogenase     Status: None   Collection Time: 05/14/14  8:35 PM  Result Value Ref Range   LDH 122 94 - 250 U/L  Uric acid     Status: None   Collection Time: 05/14/14  8:35 PM  Result Value Ref Range   Uric Acid, Serum 4.8 2.4 - 7.0 mg/dL  Type and screen     Status: None   Collection Time: 05/14/14  8:35 PM  Result Value Ref Range   ABO/RH(D) A POS    Antibody Screen NEG    Sample Expiration 05/17/2014    Hgb A1C pending.  Assessment:  IUP at 37  weeks IUGR Mildly elevated dopplers GBS positive Unfavorable cervix Possible GDM  Plan: Check CBGs q 4 hours during labor Continue observation--repeat Cytotech at 0045  Nigel BridgemanLATHAM, Ari Bernabei CNM, [place] 05/14/2014, 10:47 PM

## 2014-05-14 NOTE — H&P (Signed)
Wendy Fernandez is a 29 y.o. female, G2P0010 at 37 weeks, presenting for induction due to IUGR, abnormal dopplers at last visit.  Denies leaking, bleeding, reports +FM.  Cervix was closed, thick, vtx, -2 at last exam 05/11/14.  Patient Active Problem List   Diagnosis Date Noted  . 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 Abnormal glucose tolerance test--elevated 1 hour, refused 3 hour 05/15/2014  . Short cervix 05/15/2014  . 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 IUGR (intrauterine growth restriction) 05/14/2014  . Poor fetal growth affecting management of mother in third trimester   . Cellulitis 01/25/2014  . Positive GBS test 01/25/2014  . Short stature 01/25/2014  . Obese 01/25/2014    History of present pregnancy: Patient entered care at 9 weeks.   EDC of 06/04/14 was established by LMP and in agreement with US at 9 1/7 weeks.  Anatomy scan:  19 5/7 weeks, with growth at 17%ile, normal fluid, limited anatomy, cervix 4.24, and an anterior placenta.   Additional US evaluations:   24 4/7 weeks, for f/u anatomy:  EFW 1+7, 17%ile, HC < 2%ile, still limited anatomy of great vessels and profile, normal fluid, cervix 2.32, with funneling and shortening.   25 1/7 weeks:  EFW 1+5, 21%ile, AC < 3%ile, cervix 1.7 cm, still limited anatomy, elevated S/D ratio, AFI WNL, vtx, anterior placenta 25 4/7 weeks: Cervix 2.02, normal fluid.    02/25/14:  Vertex pres. Anterior placenta. Nl fluid. AP pposket= 4.4 cm. UA dopplers- S/D ratio= 4.34- ( mildly elevated for GA of [redacted] weeks- (50%= 3.33 for 26 weeks. No loss of diastolic flow. No flow reversal seen. Cx not measured today- appears closed by transabdominal images 03/02/14:  Vertex. Anterior placenta. Normal fluid - vertical pocket = 3.9 cm. Measured TV - small amt funneling seen. Cx appears dynamic. Umbilical artery dopplers elevated-no absent or reversed flow seen. S/D ratio=4.42. 3.33=50th% at [redacted]wks gestation. 03/09/14:  EFW  1+14, 2%ile, normal fluid, HC < 2%ile, AC 3%ile, BPD 3%ile, FL 7%ile, cervix 1.81, dopplers WNL 03/16/14:  Normal dopplers, BPP 8/8 03/23/14:  Vertex. Anterior placenta. Normal fluid. AFI of 14.6 = 50th%tile. EFW - 4th%tile. BPP 6 of 8 in 30 mins. No fetal Breathing - GA 2853w4d. Umbilical dopplers WNLs: S/D - 2.97 (50th%tile - S/D of 3.03) No loss of diastolic flow - No flow reversal. Cx funneling. Dynamic 1.66 cm - 2.1 cm. 03/30/14: Vertex. Anterior placenta. Normal fluid. AFI of 13.5 cm - 45th%tile. EFW 10th%tile. BPP 8 of 8 in 13 mins. Umbilical Dopplers: S/D = 2.85. 50th%tile =2.94. No loss of diastolic - no flow reversal. Cx closed. Mildly dynamic - 1.8 cm - 2.3 cm Measured TV. 04/13/14:  EFW 3+5, 2%ile, normal fluid, HC <2%ile, BPD 20%ile, AC <2%ile, FL 3%ile.  Elevated S/D ratio, no ADEF/reverse flow, cervix closed with funneling/dynamic, 1.25-1.51 04/17/14:  BPP 8/8, UA dopplers slightly elevated, normal fluid. 04/24/14:  BPP 8/8, dopplers unchanged, normal fluid 05/01/14 at MFM: EFW 3+12, <10%ile, AC < 3%ile, AFI 14.2, 51%ile, vtx,  UA dopplers upper normal range.   05/04/14:  Normal fluid, BPP 6/8, mildly elevated dopplers, vtx 05/11/14:  AFI 13.8, BPP 8/8, mildly elevated dopplers, vtx, no AEDF/reverse flow.  Significant prenatal events:  Had first trimester spotting, with normal US.  Thyroid studies done due to enlarged thyroid, WNL.  Declined genetic testing.  Declined flu vaccine and TDAP.  Seen in [facility] 01/25/14 with insect bite vs skin lesion--on Keflex and Ibuprophen.  US showed growth lag and  shortened/funneled cervix at 25 weeks--betamethasone course given and vag progesterone begun, with weekly cervical evaluation by [place].  F/U US showed persistent growth lag, with patient referred to MFM.  Mildly elevated dopplers also noted.  Recommendations made for antenatal testing/serial dopplers/BPPs, serial [place] for growth, and induction at 37 weeks.  Had elevated 1 hour GTT (194), with patient refusing 3 hour  GTT.  Also had LGSIL noted on pap 01/2014, with recommendation for colpo, but patient declined, stating she had been told no colpo would be done during pregnancy.   She was placed on Fe supplement for mild anemia.  She declined NSTs at 33 1/7 weeks and at 35 1/7 weeks.  She had toxo titers done at 04/13/14 due to IUGR and small HC--+HSV 2 by titer, with Valtrex begun then. Last evaluation:  05/11/14:  Cervix closed, long, vtx, -2.  Normal BPP and fluid, dopplers mildly elevated, no AEDF or reversal.  Scheduled for induction 05/14/14.  OB History    Gravida Para Term Preterm AB TAB SAB Ectopic Multiple Living   2 0 0 0 1 1  0 0 0    2009--TAB at 13 weeks  Past Medical History  Diagnosis Date  . Cellulitis   . Anemia   . Vaginal Pap smear, abnormal   . Herpes   . Enlarged thyroid   . IUGR (intrauterine growth restriction)    Surgical Hx:  D&C 2009 for TAB at 13 weeks  Family History: family history includes Asthma in her brother; Cancer in her maternal aunt and paternal grandmother; Depression in her brother; Diabetes in her maternal grandfather; Heart disease in her maternal grandmother; Hypertension in her father, maternal grandfather, and mother; Kidney disease in her maternal grandfather; Schizophrenia in her maternal grandmother. There is no history of Arthritis or Alcohol abuse.   Social History:  reports that she quit smoking about 3 years ago. She does not have any smokeless tobacco history on file. She reports that she does not drink alcohol or use illicit drugs.  Patient is African American, of the [place] faith, 4 yr college educated, employed as Museum/gallery conservator.  FOB is involved and supportive.     Prenatal Transfer Tool  Maternal Diabetes: Suspected--elevated 1 hour GTT (195), refused 3 hour GTT.  CBG 161 on admission, Hgb A1C pending from admission Genetic Screening: Declined Maternal Ultrasounds/Referrals: Abnormal:  Findings:   IUGR, elevated dopplers Fetal Ultrasounds or  other Referrals:  Referred to Materal Fetal Medicine due to IUGR and short cervix at 25 weeks. Maternal Substance Abuse:  No Significant Maternal Medications:  Meds include: Other: Received betamethasone course at 25 weeks Significant Maternal Lab Results: Lab values include: Group B Strep positive  TDAP declined Flu declined  ROS:  +FM  Allergies  Allergen Reactions  . Other Hives    Cayenne Pepper     Dilation: 1 Station: -1 Exam by:: Manfred Arch, CNM Blood pressure 125/82, pulse 112, temperature 98.3 F (36.8 C), temperature source Oral, resp. rate 16, height  (1.473 m), weight 253 lb (114.76 kg), last menstrual period 08/28/2013.  Chest clear Heart RRR without murmur Abd gravid, NT, FH 38 cm Pelvic: Posterior, 1 cm, 50%, vtx, -1 No HSV lesions noted on external vulva, in vagina, or on cervix Ext: WNL  FHR: Category 1 UCs:  None  Prenatal labs: ABO, Rh: --/--/A POS (03/03 2035) Antibody: NEG (03/03 2035) Rubella:   Immune RPR: Nonreactive (08/10 0000)  HBsAg: Negative (08/10 0000)  HIV: Non-reactive (08/10 0000)  GBS: Positive (09/13 0000) in urine Sickle cell/Hgb electrophoresis:  AA Pap:  01/13/14--LGSIL, colpo planned but not done during pregnancy GC:  Negative 10/30/13 Chlamydia:  Negative 10/30/13 Genetic screenings:  Declined Glucola:  Elevated at 195, refused 3 hour GTT Other:   Hgb 12.4 at NOB, 10.8 at 28 weeks Torch titers +HSV 2 TSH, T4 WNL    Assessment/Plan: IUP at 37 1/7 weeks--for induction IUGR with elevated dopplers GBS positive Elevated 1 hour GTT, no 3 hour GTT--suspect GDM BMI 55.4 HSV 2--no recent/current lesions  Plan: Admit to Birthing Suite per consult with Dr. Estanislado Pandy Routine [facility] orders Plan cytotech for cervical ripening, then pitocin Patient planning unmedicated labor--encouraged to keep options open Hgb A1C on admit labs, with CMP, LDH, uric acid If initial serum glucose and/or Hgb A1C elevated, will check CBGs q 4  hours. Start GBS prophylaxis with onset of pitocin or active labor. Pain med/epidural prn  Tylena Prisk, VICKICNM, [place] 05/15/2014, 8:52p

## 2014-05-15 ENCOUNTER — Inpatient Hospital Stay (HOSPITAL_COMMUNITY): Payer: Medicaid Other | Admitting: Anesthesiology

## 2014-05-15 ENCOUNTER — Encounter (HOSPITAL_COMMUNITY): Payer: Self-pay

## 2014-05-15 DIAGNOSIS — R768 Other specified abnormal immunological findings in serum: Secondary | ICD-10-CM | POA: Diagnosis present

## 2014-05-15 DIAGNOSIS — N883 Incompetence of cervix uteri: Secondary | ICD-10-CM | POA: Diagnosis not present

## 2014-05-15 DIAGNOSIS — R7309 Other abnormal glucose: Secondary | ICD-10-CM | POA: Diagnosis present

## 2014-05-15 DIAGNOSIS — IMO0002 Reserved for concepts with insufficient information to code with codable children: Secondary | ICD-10-CM | POA: Diagnosis not present

## 2014-05-15 LAB — GLUCOSE, CAPILLARY
GLUCOSE-CAPILLARY: 102 mg/dL — AB (ref 70–99)
GLUCOSE-CAPILLARY: 89 mg/dL (ref 70–99)
GLUCOSE-CAPILLARY: 97 mg/dL (ref 70–99)
Glucose-Capillary: 104 mg/dL — ABNORMAL HIGH (ref 70–99)
Glucose-Capillary: 82 mg/dL (ref 70–99)
Glucose-Capillary: 70 mg/dL (ref 70–99)

## 2014-05-15 LAB — ABO/RH: ABO/RH(D): A POS

## 2014-05-15 MED ORDER — FENTANYL 2.5 MCG/ML BUPIVACAINE 1/10 % EPIDURAL INFUSION (WH - ANES)
14.0000 mL/h | INTRAMUSCULAR | Status: DC | PRN
  Administered 2014-05-15 – 2014-05-16 (×3): 14 mL/h via EPIDURAL
  Filled 2014-05-15 (×3): qty 125

## 2014-05-15 MED ORDER — PHENYLEPHRINE 40 MCG/ML (10ML) SYRINGE FOR IV PUSH (FOR BLOOD PRESSURE SUPPORT)
PREFILLED_SYRINGE | INTRAVENOUS | Status: AC
  Filled 2014-05-15: qty 20

## 2014-05-15 MED ORDER — FENTANYL 2.5 MCG/ML BUPIVACAINE 1/10 % EPIDURAL INFUSION (WH - ANES)
INTRAMUSCULAR | Status: DC | PRN
  Administered 2014-05-15: 11 mL/h via EPIDURAL

## 2014-05-15 MED ORDER — PHENYLEPHRINE 40 MCG/ML (10ML) SYRINGE FOR IV PUSH (FOR BLOOD PRESSURE SUPPORT)
80.0000 ug | PREFILLED_SYRINGE | INTRAVENOUS | Status: DC | PRN
  Filled 2014-05-15: qty 2

## 2014-05-15 MED ORDER — EPHEDRINE 5 MG/ML INJ
10.0000 mg | INTRAVENOUS | Status: DC | PRN
  Filled 2014-05-15: qty 2

## 2014-05-15 MED ORDER — EPHEDRINE 5 MG/ML INJ
10.0000 mg | INTRAVENOUS | Status: DC | PRN
  Filled 2014-05-15: qty 4
  Filled 2014-05-15: qty 2

## 2014-05-15 MED ORDER — OXYTOCIN 40 UNITS IN LACTATED RINGERS INFUSION - SIMPLE MED
1.0000 m[IU]/min | INTRAVENOUS | Status: DC
  Administered 2014-05-15: 2 m[IU]/min via INTRAVENOUS

## 2014-05-15 MED ORDER — TERBUTALINE SULFATE 1 MG/ML IJ SOLN: 0.2500 mg | Freq: Once | INTRAMUSCULAR | Status: AC | PRN

## 2014-05-15 MED ORDER — LACTATED RINGERS IV SOLN
500.0000 mL | Freq: Once | INTRAVENOUS | Status: AC
  Administered 2014-05-15: 500 mL via INTRAVENOUS

## 2014-05-15 MED ORDER — LIDOCAINE HCL (PF) 1 % IJ SOLN
INTRAMUSCULAR | Status: DC | PRN
  Administered 2014-05-15: 4 mL
  Administered 2014-05-15: 3 mL

## 2014-05-15 MED ORDER — PHENYLEPHRINE 40 MCG/ML (10ML) SYRINGE FOR IV PUSH (FOR BLOOD PRESSURE SUPPORT)
80.0000 ug | PREFILLED_SYRINGE | INTRAVENOUS | Status: DC | PRN
  Filled 2014-05-15: qty 20
  Filled 2014-05-15: qty 2

## 2014-05-15 MED ORDER — FENTANYL 2.5 MCG/ML BUPIVACAINE 1/10 % EPIDURAL INFUSION (WH - ANES)
INTRAMUSCULAR | Status: AC
  Administered 2014-05-15: 14 mL/h via EPIDURAL
  Filled 2014-05-15: qty 125

## 2014-05-15 MED ORDER — DIPHENHYDRAMINE HCL 50 MG/ML IJ SOLN: 12.5000 mg | INTRAMUSCULAR | Status: DC | PRN

## 2014-05-15 NOTE — Anesthesia Procedure Notes (Addendum)
Epidural Patient location during procedure: OB Start time: 05/15/2014 2:46 PM  Staffing Anesthesiologist: FOSTER, MICHAEL A. Performed by: anesthesiologist   Preanesthetic Checklist Completed: patient identified, site marked, surgical consent, pre-op evaluation, timeout performed, IV checked, risks and benefits discussed and monitors and equipment checked  Epidural Patient position: sitting Prep: site prepped and draped and DuraPrep Patient monitoring: continuous pulse ox and blood pressure Approach: midline Location: L3-L4 Injection technique: LOR air  Needle:  Needle type: Tuohy  Needle gauge: 17 G Needle length: 9 cm and 9 Needle insertion depth: 5 cm and 10 cm Catheter type: closed end flexible Catheter size: 19 Gauge Catheter at skin depth: 15 cm Test dose: negative and Other  Assessment Events: blood not aspirated, injection not painful, no injection resistance, negative IV test and no paresthesia  Additional Notes Patient identified. Risks and benefits discussed including failed block, incomplete  Pain control, post dural puncture headache, nerve damage, paralysis, blood pressure Changes, nausea, vomiting, reactions to medications-both toxic and allergic and post Partum back pain. All questions were answered. Patient expressed understanding and wished to proceed. Sterile technique was used throughout procedure. Epidural site was Dressed with sterile barrier dressing. No paresthesias, signs of intravascular injection Or signs of intrathecal spread were encountered.  Patient was more comfortable after the epidural was dosed. Please see RN's note for documentation of vital signs and FHR which are stable.   Epidural Patient location during procedure: OB Start time: 05/16/2014 8:50 AM End time: 05/16/2014 9:02 AM  Staffing Anesthesiologist: Rosario JacksPOTISEK, Sakiyah Shur GUFFEY Performed by: anesthesiologist   Preanesthetic Checklist Completed: patient identified, site marked,  surgical consent, pre-op evaluation, timeout performed, IV checked, risks and benefits discussed and monitors and equipment checked  Epidural Patient position: sitting Prep: site prepped and draped and DuraPrep Patient monitoring: continuous pulse ox and blood pressure Approach: midline Location: L3-L4 Injection technique: LOR saline  Needle:  Needle type: Tuohy  Needle gauge: 17 G Needle length: 9 cm and 9 Catheter type: closed end flexible Catheter size: 19 Gauge Catheter at skin depth: 15 cm Test dose: negative  Assessment Events: blood not aspirated, injection not painful, no injection resistance, negative IV test and no paresthesia  Additional Notes Epidural replaced due to inadequate levels.  Discussed replacement w/ patient.  Single pass, one level.  LOR at 7.5 cm, catheter threaded to 15cm.  No complications.

## 2014-05-15 NOTE — Progress Notes (Signed)
Labor Progress  Subjective: Sitting in the recliner, starting to be uncomfortable with each ctx, breathing thru each one  Objective: BP 124/75 mmHg  Pulse 70  Temp(Src) 97.9 F (36.6 C) (Oral)  Resp 12  Ht 4\' 10"  (1.473 m)  Wt 253 lb (114.76 kg)  BMI 52.89 kg/m2  LMP 08/28/2013     FHT:140, moderate variability, + accel, no decel  CTX:  regular, every 2-3 minutes Uterus gravid, soft non tender SVE:  Dilation: 1 Effacement (%): 50 Station: -1 Exam by:: Daniel Johndrow, CNM Pitocin at 2910mUn/min  Assessment:  IUP at 37.1 weeks NICHD: Category 1 Membranes:  intact Labor progress: IOL Pitocin Augmentation GBS: positive Foley bulb placed   Plan: Continue labor plan Continuous monitoring Frequent position changes to facilitate fetal rotation and descent. Will reassess with cervical exam at 1700 or earlier if necessary Hold pitocin at current rate      Edmonia Gonser, CNM, MSN 05/15/2014. 1:03 PM

## 2014-05-15 NOTE — Progress Notes (Signed)
Labor Progress  Subjective: Over all comfortable, uncomfortable with VE  Objective: BP 119/45 mmHg  Pulse 94  Temp(Src) 98.3 F (36.8 C) (Oral)  Resp 16  Ht 4\' 10"  (1.473 m)  Wt 253 lb (114.76 kg)  BMI 52.89 kg/m2  LMP 08/28/2013     FHT: 140 moderate variability, + accel, no decel CTX:  regular, every 3-4 minutes Uterus gravid, soft non tender SVE:  Dilation: 1 Effacement (%): 50 Station: -1 Exam by:: Kerstyn Coryell, CNM   Assessment:  IUP at 37.1 weeks NICHD: Category 1 Membranes: intact Labor progress: IOL Cytotec x 3 WUJ:[id number]:positive A1C pending A1GDM: stable sugars 102 at 0500   Plan: Continue labor plan Continuous monitoring Ambulate Frequent position changes to facilitate fetal rotation and descent. Will reassess with cervical exam at 1200 or earlier if necessary Start pitocin per protocol      Elaf Clauson, CNM, MSN 05/15/2014. 9:08 AM

## 2014-05-15 NOTE — Progress Notes (Addendum)
  Subjective: Drowsy, occasional nap.  FOB at bedside.  Objective:   VSS, afebrile.    FHT: Category 1 UC:   None SVE:   Dilation: 1 Effacement (%): 50 Station: -1 Exam by:: Vl[place], CNM  Cervix very posterior 2nd dose of Cytotech placed in posterior fornix.  Assessment:  Induction for IUGR, mildly elevated dopplers GBS positive   Plan: Continue cervical ripening.  Nigel BridgemanLATHAM, Avante Carneiro CNM 05/15/2014, 0030

## 2014-05-15 NOTE — Progress Notes (Signed)
Labor Progress  Subjective: Comfortable, no complaints  Objective: BP 140/90 mmHg  Pulse 69  Temp(Src) 98.7 F (37.1 C) (Oral)  Resp 16  Ht 4\' 10"  (1.473 m)  Wt 253 lb (114.76 kg)  BMI 52.89 kg/m2  SpO2 98%  LMP 08/28/2013   Total I/O In: -  Out: 280 [Urine:280]   FHT: 140, moderate variability, + accel, no decel CTX:  regular, every 2-3 minutes Uterus gravid, soft non tender SVE:  4/60/-1 Pitocin at 7210mUn/min  Assessment:  IUP at 37.1 weeks NICHD: Category 1 Membranes:  intact Labor progress: IOL Pitocin Augmentation GBS: positive A1GDM: stable sugars  A1C pending Foley bulb fell out at 1745   Plan: Continue labor plan Continuous monitoring Rest Frequent position changes to facilitate fetal rotation and descent. Continue pitocin per protocol     Kreig Parson, CNM, MSN 05/15/2014. 6:22 PM

## 2014-05-15 NOTE — Anesthesia Preprocedure Evaluation (Signed)
Anesthesia Evaluation  Patient identified by MRN, date of birth, ID band Patient awake    Reviewed: Allergy & Precautions, Patient's Chart, lab work & pertinent test results  Airway Mallampati: III  TM Distance: >3 FB Neck ROM: Full    Dental no notable dental hx. (+) Teeth Intact   Pulmonary former smoker,  breath sounds clear to auscultation  Pulmonary exam normal       Cardiovascular negative cardio ROS  Rhythm:Regular Rate:Normal     Neuro/Psych negative neurological ROS  negative psych ROS   GI/Hepatic Neg liver ROS, GERD-  Medicated and Controlled,  Endo/Other  negative endocrine ROS  Renal/GU negative Renal ROS  negative genitourinary   Musculoskeletal negative musculoskeletal ROS (+)   Abdominal (+) + obese,   Peds  Hematology  (+) anemia ,   Anesthesia Other Findings   Reproductive/Obstetrics (+) Pregnancy                             Anesthesia Physical Anesthesia Plan  ASA: III  Anesthesia Plan: Epidural   Post-op Pain Management:    Induction:   Airway Management Planned: Natural Airway  Additional Equipment:   Intra-op Plan:   Post-operative Plan:   Informed Consent: I have reviewed the patients History and Physical, chart, labs and discussed the procedure including the risks, benefits and alternatives for the proposed anesthesia with the patient or authorized representative who has indicated his/her understanding and acceptance.     Plan Discussed with: Anesthesiologist  Anesthesia Plan Comments:         Anesthesia Quick Evaluation

## 2014-05-15 NOTE — Progress Notes (Signed)
  Subjective: Has slept at intervals, denies UCs.  Objective: BP 125/82 mmHg  Pulse 112  Temp(Src) 98.3 F (36.8 C) (Oral)  Resp 16  Ht 4\' 10"  (1.473 m)  Wt 253 lb (114.76 kg)  BMI 52.89 kg/m2  LMP 08/28/2013    CBG (last 3)   Recent Labs  05/15/14 0035 05/15/14 0502  GLUCAP 97 102*       FHT: Category 1 UC:   Minimal SVE:   Dilation: 1 Station: -1 Exam by:: Wendy Fernandez, CNM  Cervix very posterior. 3rd Cytotech dose placed in posterior fornix  Assessment:  Induction for IUGR GBS positive  Plan: Continue cervical ripening. Hgb A1C still pending.  Wendy Fernandez, Wendy Fernandez CNM 05/15/2014, 4:44 AM

## 2014-05-15 NOTE — Progress Notes (Signed)
Wendy Fernandez MRN: [id number]  Subjective: -Care Assumed of 28y.o. G2P0 at 37.1wks who presents for IOL d/t IUGR and elevated dopplers.  Patient resting in bed, states that "I just feel like I need to lay down."  Patient reports lower abdominal pressure.  FOB and his father at bedside.   Objective: BP 128/66 mmHg  Pulse 80  Temp(Src) 98.4 F (36.9 C) (Oral)  Resp 18  Ht 4\' 10"  (1.473 m)  Wt 253 lb (114.76 kg)  BMI 52.89 kg/m2  SpO2 98%  LMP 08/28/2013 I/O last 3 completed shifts: In: -  Out: 580 [Urine:580]   FHT: 155 bpm, Mod Var, -Decels, +Accels UC:  Q1-752min, palpates mild  SVE:   Dilation: 4 Effacement (%): 50 Station: -1 Exam by:: Gerrit HeckJessica Aleksa Collinsworth Membranes: Intact Pitocin: 5815mUn/min  Assessment:  IUP at 37.1wks Cat I FT  IUGR IOL Pitocin Titration  Plan: -Discussed R/B of amniotomy, patient declines at current -Continue pitocin titration and position changes -Continue other mgmt as ordered  [name],MSN, CNM 05/15/2014, 8:28 PM

## 2014-05-16 ENCOUNTER — Encounter (HOSPITAL_COMMUNITY): Payer: Self-pay

## 2014-05-16 LAB — HEMOGLOBIN A1C
Hgb A1c MFr Bld: 5.5 % (ref 4.8–5.6)
Mean Plasma Glucose: 111 mg/dL

## 2014-05-16 LAB — GLUCOSE, CAPILLARY
GLUCOSE-CAPILLARY: 107 mg/dL — AB (ref 70–99)
Glucose-Capillary: 114 mg/dL — ABNORMAL HIGH (ref 70–99)

## 2014-05-16 LAB — RPR: RPR: NONREACTIVE

## 2014-05-16 LAB — HIV ANTIBODY (ROUTINE TESTING W REFLEX): HIV Screen 4th Generation wRfx: NONREACTIVE

## 2014-05-16 MED ORDER — ONDANSETRON HCL 4 MG PO TABS: 4.0000 mg | ORAL_TABLET | ORAL | Status: DC | PRN

## 2014-05-16 MED ORDER — PRENATAL MULTIVITAMIN CH
1.0000 | ORAL_TABLET | Freq: Every day | ORAL | Status: DC
  Administered 2014-05-17 – 2014-05-18 (×2): 1 via ORAL
  Filled 2014-05-16 (×2): qty 1

## 2014-05-16 MED ORDER — DIBUCAINE 1 % RE OINT: 1.0000 "application " | TOPICAL_OINTMENT | RECTAL | Status: DC | PRN

## 2014-05-16 MED ORDER — SIMETHICONE 80 MG PO CHEW: 80.0000 mg | CHEWABLE_TABLET | ORAL | Status: DC | PRN

## 2014-05-16 MED ORDER — LIDOCAINE-EPINEPHRINE (PF) 2 %-1:200000 IJ SOLN
INTRAMUSCULAR | Status: DC | PRN
  Administered 2014-05-16 (×2): 5 mL via INTRADERMAL

## 2014-05-16 MED ORDER — ZOLPIDEM TARTRATE 5 MG PO TABS
5.0000 mg | ORAL_TABLET | Freq: Every evening | ORAL | Status: DC | PRN
Start: 2014-05-16 — End: 2014-05-18

## 2014-05-16 MED ORDER — LANOLIN HYDROUS EX OINT
TOPICAL_OINTMENT | CUTANEOUS | Status: DC | PRN
Start: 2014-05-16 — End: 2014-05-18

## 2014-05-16 MED ORDER — TETANUS-DIPHTH-ACELL PERTUSSIS 5-2.5-18.5 LF-MCG/0.5 IM SUSP: 0.5000 mL | Freq: Once | INTRAMUSCULAR | Status: DC

## 2014-05-16 MED ORDER — IBUPROFEN 600 MG PO TABS
600.0000 mg | ORAL_TABLET | Freq: Four times a day (QID) | ORAL | Status: DC
  Administered 2014-05-16 – 2014-05-18 (×8): 600 mg via ORAL
  Filled 2014-05-16 (×8): qty 1

## 2014-05-16 MED ORDER — OXYCODONE-ACETAMINOPHEN 5-325 MG PO TABS: 2.0000 | ORAL_TABLET | ORAL | Status: DC | PRN

## 2014-05-16 MED ORDER — BENZOCAINE-MENTHOL 20-0.5 % EX AERO
1.0000 "application " | INHALATION_SPRAY | CUTANEOUS | Status: DC | PRN
  Administered 2014-05-16: 1 via TOPICAL
  Filled 2014-05-16: qty 56

## 2014-05-16 MED ORDER — OXYCODONE-ACETAMINOPHEN 5-325 MG PO TABS: 1.0000 | ORAL_TABLET | ORAL | Status: DC | PRN

## 2014-05-16 MED ORDER — DIPHENHYDRAMINE HCL 25 MG PO CAPS: 25.0000 mg | ORAL_CAPSULE | Freq: Four times a day (QID) | ORAL | Status: DC | PRN

## 2014-05-16 MED ORDER — WITCH HAZEL-GLYCERIN EX PADS: 1.0000 "application " | MEDICATED_PAD | CUTANEOUS | Status: DC | PRN

## 2014-05-16 MED ORDER — SENNOSIDES-DOCUSATE SODIUM 8.6-50 MG PO TABS
2.0000 | ORAL_TABLET | ORAL | Status: DC
  Filled 2014-05-16 (×2): qty 2

## 2014-05-16 MED ORDER — ONDANSETRON HCL 4 MG/2ML IJ SOLN: 4.0000 mg | INTRAMUSCULAR | Status: DC | PRN

## 2014-05-16 NOTE — Progress Notes (Signed)
  Subjective: Sleeping s/p epidural replacement.  Objective: BP 139/80 mmHg  Pulse 105  Temp(Src) 98.6 F (37 C) (Oral)  Resp 18  Ht 4\' 10"  (1.473 m)  Wt 253 lb (114.76 kg)  BMI 52.89 kg/m2  SpO2 98%  LMP 08/28/2013 I/O last 3 completed shifts: In: -  Out: 1350 [Urine:1350] Total I/O In: -  Out: 500 [Urine:500]  FHT: Category 1 UC:   regular, every 3 minutes SVE:   Deferred at present Pitocin at 12 mu/min MVUs now adequate since 8am--190-205  Assessment:  Induction for IUGR GBS positive Early labor Adequate labor  Plan: Continue to observe, continue pitocin augmentation to maintain adequacy. Re-evaluate prn after appropriate time of adequate labor.  Nigel BridgemanLATHAM, Libia Fazzini CNM 05/16/2014, 10:22 AM

## 2014-05-16 NOTE — Progress Notes (Signed)
  Subjective: Lower abdominal pain with UCs.  Denies pressure.  Objective: BP 150/102 mmHg  Pulse 86  Temp(Src) 98 F (36.7 C) (Oral)  Resp 18  Ht 4\' 10"  (1.473 m)  Wt 253 lb (114.76 kg)  BMI 52.89 kg/m2  SpO2 98%  LMP 08/28/2013 I/O last 3 completed shifts: In: -  Out: 1350 [Urine:1350]      FHT: Category 1 UC:   regular, every 2-3 minutes SVE:   Dilation: 4.5 Effacement (%): 50 Station: -1 Exam by:: Holli Rengel, cnm --cervix unchanged from exam at 0530 MVUs 150-170 Pitocin at 12 mu/min  Assessment:  Early labor Inadequate MVUs  Plan: Continue to work with epidural for comfort. Continue augmentation to establish/maintain adequacy of labor.  Nigel BridgemanLATHAM, Sebastien Jackson CNM 05/16/2014, 7:47 AM

## 2014-05-16 NOTE — Lactation Note (Signed)
This note was copied from the chart of Wendy Fabian SharpDaquita Wedin. Lactation Consultation Note Initial visit at 8 hours of age.  Mom reports baby has done a 5 minute feeding and she feels baby got something.  Baby is 7544w2d and 5#2oz.  Baby asleep on mom post bath and STS.  Peds here to assess baby.  Attempted hand expression not a drop visible.  Baby showing feeding cues,  Baby to breast.  Assisted with cross cradle hold, baby latched with assist for a few strong sucks and then stopped and back to sleep.  Re visited after several minutes with DEBP for mom and baby was with visitor so mom could use the bathroom and had a low temp so baby is back with mom STS.  Mom needs to begin pumping and plans to after STS and baby is stable.  Encouraged mom to use preemie setting for 15 minutes followed by hand expression every 3 hours/8 times in 24hours.  Set up instructions and cleaning discussed.  Visitor at bedside supportive.  [facility] resources given and discussed.  Encouraged to feed with early cues on demand.  Early newborn behavior discussed.  Hand expression done by mom with instructions, no colostrum visible.  Mom plans to offer baby alimentum by spoon if she is not collecting EBM.  Green LPI education sheet given and discussed as low birth weight applies to this baby. Mom receptive to teaching.   Mom to call for assist as needed.    Patient Name: Wendy Fernandez OZHYQ'MToday's Date: 05/16/2014 Reason for consult: Initial assessment   Maternal Data Has patient been taught Hand Expression?: Yes Does the patient have breastfeeding experience prior to this delivery?: No  Feeding Feeding Type: Breast Fed Length of feed:  (few sucks)  LATCH Score/Interventions Latch: Repeated attempts needed to sustain latch, nipple held in mouth throughout feeding, stimulation needed to elicit sucking reflex. Intervention(s): Skin to skin;Teach feeding cues;Waking techniques Intervention(s): Breast compression  Audible Swallowing:  None Intervention(s): Skin to skin;Hand expression  Type of Nipple: Everted at rest and after stimulation Intervention(s): Hand pump;Double electric pump  Comfort (Breast/Nipple): Soft / non-tender     Hold (Positioning): Assistance needed to correctly position infant at breast and maintain latch. Intervention(s): Breastfeeding basics reviewed;Support Pillows;Position options;Skin to skin  LATCH Score: 6  Lactation Tools Discussed/Used WIC Program: No Pump Review: Setup, frequency, and cleaning Initiated by:: JS Date initiated:: 05/16/14   Consult Status Consult Status: Follow-up Date: 05/17/14 Follow-up type: In-patient    Beverely RisenShoptaw, Arvella MerlesJana Lynn 05/16/2014, 9:53 PM

## 2014-05-16 NOTE — Progress Notes (Signed)
Fabian SharpDaquita Jersey MRN: [id number]  Subjective: -Patient resting in bed.  Requests usage of bedpan.  Voiding without issues and reports that bladder is emptying during each void.    Objective: BP 133/62 mmHg  Pulse 82  Temp(Src) 99 F (37.2 C) (Axillary)  Resp 18  Ht 4\' 10"  (1.473 m)  Wt 253 lb (114.76 kg)  BMI 52.89 kg/m2  SpO2 98%  LMP 08/28/2013 I/O last 3 completed shifts: In: -  Out: 580 [Urine:580] Total I/O In: -  Out: 300 [Urine:300] FHT: 135 bpm, Mod Var, -Decels, +Accels UC: Occasional, palpates mild   SVE:   Dilation: 4 Effacement (%): 50 Station: -1 Exam by:: Gerrit HeckJessica Asyia Hornung CNM Membranes: Intact Pitocin: 4122mUn/min  Assessment:  IUP at 37.2wks Cat I FT  IUGR IOL Pitocin Titration Epidural  Plan: -Discussed POC to include amniotomy and IUPC insertion or temporary discontinue pitocin and reassess in 2 hours -Patient reports fear of "something happening," with AROM and wants provider to assure her that nothing will happen and she won't end up with a c/s -Provider able to provide realistic approach in that no guarantees can be made regarding delivery outcomes, but assurances made that goal is for healthy baby and healthy mom -Patient further educated on amniotomy and benefits during IOL process -Questions and concerns further addressed -Discontinue pitocin and plan to reassess in 2 hours -Continue other mgmt as ordered  Kelce Bouton LYNN,MSN, CNM 05/16/2014, 3:43 AM

## 2014-05-16 NOTE — Progress Notes (Signed)
  Subjective: Feeling lots of pressure.  No pain with UCs, just pressure.  Objective: BP 163/109 mmHg  Pulse 92  Temp(Src) 98.6 F (37 C) (Oral)  Resp 18  Ht 4\' 10"  (1.473 m)  Wt 253 lb (114.76 kg)  BMI 52.89 kg/m2  SpO2 98%  LMP 08/28/2013 I/O last 3 completed shifts: In: -  Out: 1350 [Urine:1350] Total I/O In: -  Out: 500 [Urine:500]   Filed Vitals:   05/16/14 0930 05/16/14 0950 05/16/14 1000 05/16/14 1030  BP: 127/91 139/80 140/100 163/109  Pulse: 99 105 101 92  Temp:  98.6 F (37 C)    TempSrc:      Resp:  18    Height:      Weight:      SpO2:        FHT: Category 2-- mild variables with pushing, moderate variability UC:   regular, every 4-5 minutes SVE: Complete, vtx +1.   Pitocin at 12 mu/min MVUs 230  Assessment:  2nd stage labor IUGR  Plan: Will initiate active pushing and observe status--if no progress, will defer pushing for laboring down if patient able.  Nigel BridgemanLATHAM, Nemiah Bubar CNM 05/16/2014, 11:11 AM

## 2014-05-16 NOTE — Progress Notes (Signed)
Wendy SharpDaquita Fernandez MRN: [id number]  Subjective: -Patient reports anxiety regarding amniotomy and IUPC insertion.   Objective: BP 123/44 mmHg  Pulse 83  Temp(Src) 99 F (37.2 C) (Axillary)  Resp 18  Ht 4\' 10"  (1.473 m)  Wt 253 lb (114.76 kg)  BMI 52.89 kg/m2  SpO2 98%  LMP 08/28/2013 I/O last 3 completed shifts: In: -  Out: 580 [Urine:580] Total I/O In: -  Out: 370 [Urine:370] FHT: 130 bpm, Mod Var, -Decels, +Accels UC:  None graphed or palpated  SVE:   Dilation: 4 Effacement (%): 50 Station: -1 Exam by:: Gerrit HeckJessica Araya Roel CNM Membranes: AROM of moderate amt, clear fluid at 0530 Pitocin: Restart at 86mUn/min  Assessment:  IUP at 37.2wks Cat I FT  IUGR IOL Amniotomy GBS Positive  Plan: -Questions and concerns addressed, reassurances given regarding amniotomy and iupc -Patient verbalizes understanding and agrees with plan -Restart pitocin at 6mUn and titrate based on MVUs -Continue other mgmt as ordered -Will report off to oncoming provider, Manfred ArchV. Latham, CNM  Graesyn Schreifels [facility] LLCYNN,MSN, CNM 05/16/2014, 5:35 AM

## 2014-05-17 LAB — CBC
HCT: 29.3 % — ABNORMAL LOW (ref 36.0–46.0)
HEMOGLOBIN: 9.7 g/dL — AB (ref 12.0–15.0)
MCH: 29.9 pg (ref 26.0–34.0)
MCHC: 33.1 g/dL (ref 30.0–36.0)
MCV: 90.4 fL (ref 78.0–100.0)
PLATELETS: 228 10*3/uL (ref 150–400)
RBC: 3.24 MIL/uL — AB (ref 3.87–5.11)
RDW: 14.4 % (ref 11.5–15.5)
WBC: 14.8 10*3/uL — ABNORMAL HIGH (ref 4.0–10.5)

## 2014-05-17 MED ORDER — FERROUS SULFATE 325 (65 FE) MG PO TABS
325.0000 mg | ORAL_TABLET | Freq: Two times a day (BID) | ORAL | Status: DC
  Administered 2014-05-17 – 2014-05-18 (×2): 325 mg via ORAL
  Filled 2014-05-17 (×2): qty 1

## 2014-05-17 NOTE — Progress Notes (Signed)
Wendy Fernandez   Subjective: Post Partum Day 1 Vaginal delivery, 1 degree laceration Patient up ad lib, denies syncope or dizziness. Reports consuming regular diet without issues and denies N/V No issues with urination and reports bleeding is appropriate  Feeding:  Breast/bottle Contraceptive plan:   none  Objective: Temp:  [98.1 F (36.7 C)-98.6 F (37 C)] 98.1 F (36.7 C) (03/06 0430) Pulse Rate:  [71-149] 83 (03/06 0430) Resp:  [16-20] 16 (03/06 0430) BP: (110-148)/(54-87) 110/54 mmHg (03/06 0430) SpO2:  [100 %] 100 % (03/06 0430)  Physical Exam:  General: alert and cooperative Ext: WNL, no edema. No evidence of DVT seen on physical exam. Breast: Soft filling Lungs: CTAB Heart RRR without murmur  Abdomen:  Soft, fundus firm, lochia scant, + bowel sounds, non distended, non tender Lochia: appropriate Uterine Fundus: firm Laceration: healing well    Recent Labs  05/14/14 2035 05/17/14 0555  HGB 11.1* 9.7*  HCT 34.0* 29.3*    Assessment S/P Vaginal Delivery-Day 1 Stable  Normal Involution Breastfeeding/Bottlefeeding   Plan: Continue current care Plan for discharge tomorrow, Breastfeeding and Lactation consult Iron supplement   Carine Nordgren, CNM, MSN 05/17/2014, 11:20 AM

## 2014-05-17 NOTE — Lactation Note (Signed)
This note was copied from the chart of Wendy Fabian SharpDaquita Kinnard. Lactation Consultation Note  Patient Name: Wendy Fernandez ZOXWR'UToday's Date: 05/17/2014 Reason for consult: Follow-up assessment LC followed up with Mom to assist with breastfeeding. Mom reports RN helped her and is now giving baby a bottle. Advised Mom to call if she wants LC assist.  Maternal Data    Feeding    LATCH Score/Interventions                      Lactation Tools Discussed/Used     Consult Status Consult Status: PRN Date: 05/18/14 Follow-up type: In-patient    Wendy Fernandez, Wendy Fernandez 05/17/2014, 9:10 PM

## 2014-05-17 NOTE — Lactation Note (Signed)
This note was copied from the chart of [name] Wasko. Lactation Consultation Note  Patient Name: [name] Spang ZOXWR'UToday's Date: 05/17/2014 Reason for consult: Follow-up assessment [facility], when LC came to see Mom she had given baby a bottle. Mom reports baby is only staying on the breast 2-3 minutes so she thinks baby is not getting any milk so she gives her a bottle. Some basic teaching reviewed. LC left phone number for MOm to call with next feeding for assist.  Maternal Data    Feeding    LATCH Score/Interventions                      Lactation Tools Discussed/Used     Consult Status Consult Status: Follow-up Date: 05/17/14 Follow-up type: In-patient    Alfred LevinsGranger, Dontray Haberland Ann 05/17/2014, 9:09 PM

## 2014-05-17 NOTE — Anesthesia Postprocedure Evaluation (Signed)
Anesthesia Post Note  Patient: Wendy SharpDaquita Courter  Procedure(s) Performed: * No procedures listed *  Anesthesia type: Epidural  Patient location: Mother/Baby  Post pain: Pain level controlled  Post assessment: Post-op Vital signs reviewed  Last Vitals:  Filed Vitals:   05/17/14 0430  BP: 110/54  Pulse: 83  Temp: 36.7 C  Resp: 16    Post vital signs: Reviewed  Level of consciousness:alert  Complications: No apparent anesthesia complications

## 2014-05-18 MED ORDER — IBUPROFEN 600 MG PO TABS: 600.0000 mg | ORAL_TABLET | Freq: Four times a day (QID) | ORAL | Status: AC | PRN

## 2014-05-18 MED ORDER — FERROUS SULFATE 325 (65 FE) MG PO TABS
325.0000 mg | ORAL_TABLET | Freq: Every day | ORAL | Status: AC
Start: 2014-05-18 — End: ?

## 2014-05-18 NOTE — Discharge Summary (Signed)
Vaginal Delivery Discharge Summary  Wendy Fernandez Wendy Fernandez  DOB:    10/18/1985 MRN:    [id number] CSN:    [id number]  Date of admission:                  05/14/14  Date of discharge:                   05/18/14  Procedures this admission:   Induction of labor, SVB, repair of 1st degree periurethral laceration  Date of Delivery: 05/16/14  Newborn Data:  Live born female  Birth Weight: 5 lb 2 oz (2325 g) APGAR: 9, 9  Home with mother. Name: Wendy Fernandez   History of Present Illness:  Ms. Wendy Fernandez is a 29 y.o. female, G2P1011, who presents at 3243w2d weeks gestation. [facility]. She was admitted induction of labor for IUGR, abnormal dopplers. Her pregnancy has been complicated by:  Patient Active Problem List   Diagnosis Date Noted  . Vaginal delivery 05/16/2014  . 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 Abnormal glucose tolerance test--elevated 1 hour, refused 3 hour 05/15/2014  . Short cervix 05/15/2014  . 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 IUGR (intrauterine growth restriction) 05/14/2014  . Poor fetal growth affecting management of mother in third trimester   . Cellulitis 01/25/2014  . Positive GBS test 01/25/2014  . Short stature 01/25/2014  . Obese 01/25/2014     Hospital Course:  Admitted 05/14/14 for induction due to IUGR and abnormal dopplers. Positive GBS. Progressed with [employer] then pitocin . Utilized epidural for pain management.  Delivery was performed by Nigel BridgemanVicki Virgil Lightner, CNM, without complication. Patient and baby tolerated the procedure without difficulty, with 1st degree periurethral and left vaginal sidewall lacerations noted. Infant status was stable and remained in room with mother.  Mother and infant then had an uncomplicated postpartum course, with breast and bottle feeding going well. Mom's physical exam was WNL, and she was discharged  home in stable condition. Contraception plan per patient was "to take precautions--declined any prescriptive contraceptives..  She received adequate benefit from po pain medications, using Motrin.  Also started on Fe due to Hgb of 9.7, with no syncope or dizziness.   Feeding:  breast and bottle  Contraception:  "Will take precautions" per patient.  Discharge hemoglobin:  HEMOGLOBIN  Date Value Ref Range Status  05/17/2014 9.7* 12.0 - 15.0 g/dL Final   HCT  Date Value Ref Range Status  05/17/2014 29.3* 36.0 - 46.0 % Final    Discharge Physical Exam:   General: alert Lochia: appropriate Uterine Fundus: firm Incision: healing well DVT Evaluation: No evidence of DVT seen on physical exam. Negative Homan's sign.  Intrapartum Procedures: spontaneous vaginal delivery Postpartum Procedures: none Complications-Operative and Postpartum: 1st degree periurethral  Discharge Diagnoses: Term Pregnancy-delivered and IUGR, GBS positive, anemia  Discharge Information:  Activity:           pelvic rest Diet:                routine Medications: Ibuprofen and Iron Condition:      stable Instructions:     Discharge to: home  Follow-up Information    Follow up with [facility] In 6 weeks.   Specialty:  Obstetrics and Gynecology   Why:  Call for any questions or concerns.   Contact information:   [address] [place] [id number]  [phone]       Nigel Bridgeman CNM 05/18/2014 9:07 AM

## 2014-05-18 NOTE — Lactation Note (Signed)
This note was copied from the chart of Wendy Fernandez. Lactation Consultation Note Baby was rooting when I entered the room.  She reports that she had just breast fed and had also given formula.  She agreed to latch the baby.  Latch was shallow.  I asked permission to touch her breast in an effort to show her how to compress the areola when it was in the baby's mouth and encouraged the baby to take more.  I briefly showed her how to do this.  The baby took a little more into her mouth but quickly fell asleep.  Mom fed her formula. I reviewed frequency of pump with mom and encouraged her that her milk would come to volume between 3 - 5 days.  Aware of support group and outpatient services.  Patient Name: Wendy Fabian SharpDaquita Karges VWUJW'JToday's Date: 05/18/2014 Reason for consult: Follow-up assessment   Maternal Data    Feeding Feeding Type: Breast Fed Length of feed: 2 min  LATCH Score/Interventions Latch: Repeated attempts needed to sustain latch, nipple held in mouth throughout feeding, stimulation needed to elicit sucking reflex.  Audible Swallowing: None  Type of Nipple: Everted at rest and after stimulation  Comfort (Breast/Nipple): Soft / non-tender     Hold (Positioning): No assistance needed to correctly position infant at breast.  LATCH Score: 7  Lactation Tools Discussed/Used     Consult Status      Soyla DryerJoseph, Niley Helbig 05/18/2014, 11:04 AM

## 2014-05-18 NOTE — Discharge Instructions (Signed)

## 2014-05-19 ENCOUNTER — Ambulatory Visit (HOSPITAL_COMMUNITY)
Admission: RE | Admit: 2014-05-19 | Discharge: 2014-05-19 | Disposition: A | Discharge status: Home / Self Care | Payer: Medicaid Other | Source: Ambulatory Visit | Attending: Obstetrics & Gynecology | Admitting: Obstetrics & Gynecology

## 2014-05-19 NOTE — Progress Notes (Signed)
UR chart review completed.  

## 2014-05-19 NOTE — Lactation Note (Signed)
Lactation Consult  Mother's reason for visit: Latch assist Visit Type: Feeding assessment Appointment Notes: None Consult:  Initial Lactation Consultant:  Huston FoleyMOULDEN, Olukemi Panchal S  ________________________________________________________________________   Baby's Name: Wendy Fernandez Date of Birth: 05/16/2012 Pediatrician: Donnie Coffinubin Gender: female Gestational Age: 7356w2d (At Birth) Birth Weight: 5 lb 2 oz (2325 g) Weight at Discharge:  Weight: (!) 5 lb 0.4 oz (2280 g) Date of Discharge: 05/18/2014 Filed Weights   05/17/14 0024 05/18/14 0031 05/18/14 1030  Weight: 5 lb 1.5 oz (2310 g) 5 lb 0.1 oz (2270 g) 5 lb 0.4 oz (2280 g)   [facility]: 5-1today Location:Pediatrician's office Weight today: 5-0.4     ________________________________________________________________________  Mother's Name: Wendy Fernandez Type of delivery:  Vaginal Breastfeeding Experience:  First baby Maternal Medical Conditions:  None per patient Maternal Medications:  Ibuprofen, PNV'S, IRON  ________________________________________________________________________  Breastfeeding History (Post Discharge)  Frequency of breastfeeding:  Every 2-3 hours Duration of feeding:  15-20 minutes  Supplementation  Formula:  Volume 30ml Frequency:  Every 3 hours        Brand: Similac    Method:  Bottle,   Pumping  Type of pump:  Manual (patient has a pump in style but has not used yet) Frequency:  Three times per day Volume:  0ml  Infant Intake and Output Assessment  Voids:  7 in 24 hrs.  Color:  Clear yellow Stools:  3 in 24 hrs.  Color:  Yellow  ________________________________________________________________________  Maternal Breast Assessment  Breast:  Filling Nipple:  Erect Pain level:  7 at home/0 here using 20 mm nipple shield Pain interventions:   Bra  _______________________________________________________________________ Feeding Assessment/Evaluation  Mom and 3 day old baby her for feeding assist.  Mom states she needs help latching because feedings are very painful.  Both nipples intact.  Assisted with positioning baby in football hold.  Parents both shown how to use good breast compression to assist with deep latch.  Baby is very sleepy and showing very little interest in latching to breast.  20 mm nipple shield applied and baby latched easily and began to actively suck.  Baby nursed for 15 minutes on both breasts.  When baby came off a drop of milk seen and shield but no milk transfer.  I feel breasts are just beginning to fill.  Mom disappointed.  Reassured her that milk is just starting to come in.  Mom was very comfortable with feeding.  Plan written for mom to breastfeed baby with any feeding cue but at least every 3 hours, post pump both breasts after feedings for 15 minutes, supplement baby after breast with 30-45 mls of expressed milk and or formula as needed.  Follow up appointment scheduled for Friday 05/22/14.  Initial feeding assessment:  Infant's oral assessment:  WNL  Positioning:  Football Right breast/left breast  LATCH documentation:  Latch:  2 = Grasps breast easily, tongue down, lips flanged, rhythmical sucking.  Audible swallowing:  1 = A few with stimulation  Type of nipple:  2 = Everted at rest and after stimulation  Comfort (Breast/Nipple):  1 = Filling, red/small blisters or bruises, mild/mod discomfort  Hold (Positioning):  1 = Assistance needed to correctly position infant at breast and maintain latch  LATCH score:  7  Attached assessment:  Deep  Lips flanged:  Yes.    Lips untucked:  No.  Suck assessment:  Displays both  Tools:  Nipple shield 20 mm Instructed on use and cleaning  of tool:  Yes.    Pre-feed weight:  2280 g  Post-feed weight:  2280 g Amount transferred:  0 ml Amount supplemented:  35  ml      Total amount transferred:  0 ml Total supplement given:  35ml

## 2014-05-22 ENCOUNTER — Ambulatory Visit (HOSPITAL_COMMUNITY): Payer: Medicaid Other

## 2014-05-22 ENCOUNTER — Ambulatory Visit (HOSPITAL_COMMUNITY): Admission: RE | Admit: 2014-05-22 | Payer: Medicaid Other | Source: Ambulatory Visit

## 2014-05-28 ENCOUNTER — Ambulatory Visit (HOSPITAL_COMMUNITY)
Admission: RE | Admit: 2014-05-28 | Discharge: 2014-05-28 | Disposition: A | Discharge status: Home / Self Care | Payer: 59 | Source: Ambulatory Visit | Attending: Obstetrics & Gynecology | Admitting: Obstetrics & Gynecology

## 2014-05-28 NOTE — Lactation Note (Signed)
Lactation Consult  Mother's reason for visit:  F/U now that my milk is in  Visit Type: Feeding assessment  Appointment Notes:  Confirmed  Consult:  Follow-Up Lactation Consultant:  Wendy Fernandez  ________________________________________________________________________ Wendy FloresBaby's Name: Wendy Fernandez Date of Birth: 05/16/2012 Pediatrician:Dr. Maryellen Pileavid Rubin  Gender: female Gestational Age: [redacted]w[redacted]d (At Birth) Birth Weight: 5 lb 2 oz (2325 g) Weight at Discharge:  Weight: (!) 5 lb 0.4 oz (2280 g) Date of Discharge: 05/18/2014 Filed Weights   05/17/14 0024 05/18/14 0031 05/18/14 1030  Weight: 5 lb 1.5 oz (2310 g) 5 lb 0.1 oz (2270 g) 5 lb 0.4 oz (2280 g)   [facility]: 5-1 oz on 3/11  Location:[facility] Weight today:2424 g , 5-5.5 oz      ________________________________________________________________________  Mother's Name: Wendy Fernandez Type of delivery:  Vaginal  Breastfeeding Experience: Sore nipples , improving per mom , left is still alittle sore  Maternal Medical Conditions:  None  Maternal Medications:  PNV , Iron   ________________________________________________________________________  Breastfeeding History (Post Discharge)  Frequency of breastfeeding:  Every 2-3 hours  Duration of feeding:  30 mins to 60 mins and if still hungry supplement or I feed her until she falls asleep   Supplementing: per mom supplementing with Alimentum  30 ml after feedings ( mentioned to mom to increase ) ( SEE PLAN BELOW )   Infant Intake and Output Assessment  Voids:  8 n 24 hrs.  Color:  Clear yellow Stools:  3-4 in 24 hrs Yellow  ________________________________________________________________________  Maternal Breast Assessment  Breast:  Soft Nipple:  Erect Pain level:  0 Pain interventions:  Expressed breast  milk  _______________________________________________________________________ Feeding Assessment/Evaluation  Initial feeding assessment: Per  Mom Fed baby at the breast for 10 mins 1 hour prior to apt.   Infant's oral assessment:  WNL  Positioning:  Cross cradle Right breast  LATCH documentation: ( latched without the nipple shield   Latch:  2 = Grasps breast easily, tongue down, lips flanged, rhythmical sucking.  Audible swallowing:  2 = Spontaneous and intermittent  Type of nipple:  2 = Everted at rest and after stimulation  Comfort (Breast/Nipple):  2 = Soft / non-tender  Hold (Positioning):  1 = Assistance needed to correctly position infant at breast and maintain latch  LATCH score: 9   Attached assessment:  Deep  Lips flanged:  Yes.    Lips untucked:  Yes.    Suck assessment:  Nutritive  Tools:  None at this latch  Instructed on use and cleaning of tool:  No.  Pre-feed weight:  2424 g , 5-5.5 oz  Post-feed weight:  2448 g , 5-6.3 oz  Amount transferred:  24 ml  Amount supplemented: none   Additional Feeding Assessment -   Infant's oral assessment:  WNL  Positioning:  Cross cradle Right breast   LATCH documentation: used a #20 NS   Latch:  1 = Repeated attempts needed to sustain latch, nipple held in mouth throughout feeding, stimulation needed to elicit sucking reflex.  Audible swallowing:  1 = A few with stimulation  Type of nipple:  2 = Everted at rest and after stimulation  Comfort (Breast/Nipple):  2 = Soft / non-tender  Hold (Positioning):  1 = Assistance needed to correctly position infant at breast and maintain latch  LATCH score: 7   Attached assessment:  Shallow  Lips flanged:  Yes.    Lips untucked:  Yes.  Suck assessment:  Nonnutritive  Tools:  Nipple shield 20 mm Instructed on use and cleaning of tool:  Yes.   On the 2nd breast fed less than 5 mins and feel asleep ,  Baby  been fed an hour prior to consult and probably wasn't ready for a  big meal    Total amount pumped post feed: did not post pump   Total amount transferred:  24 ml  Total supplement given:  None  Lactation Impression:  Mom presented with Baby at consult  Baby was woken up to feed  Per mom  fed her an hour prior to consult  Mom is working hard with feeding at the breast with a NS and doing extra pumping  ( per  Mom 4 -6 times a day but doesn't get much , best has been 30 ml )  Mom aware the the baby needs consistent calories and to limit time at the breast and watch for hanging out. ( non - nutritive sucking )   LC recommended F/U LC visit or attending the BFSG group for weight checks and mom declined scheduling the LC O/P apt , will consider the BFSG ( per mom )    Lactation Plan of Care:  Praised mom for her efforts  Feedings - continue to feed every 2 1/2 -3 hours and on demand  Feed 1st breast 15 - 20  mins max , offer 2nd breast , and supplement after feeding with 30- 45 ml  Before applying nipple shield , hand express, and roll nipple Extra pumping - after 5-6 feedings a day for 15 -20 mins both breast together  Consider Mother's Love Herb supplement - follow recommended dose  Oatmeal - Steal Cut Oats  And Lactation [employer]

## 2014-07-10 ENCOUNTER — Other Ambulatory Visit: Payer: Self-pay | Admitting: Obstetrics and Gynecology

## 2016-06-26 IMAGING — US US FETAL BPP W/O NONSTRESS
1 series · 13 of 17 positions shown · non-contrast
Comparison: none

[Series 1: us fetal bpp w/o nonstress · non-contrast · 17 acquisitions, 13 frames shown]
[im 1/17]
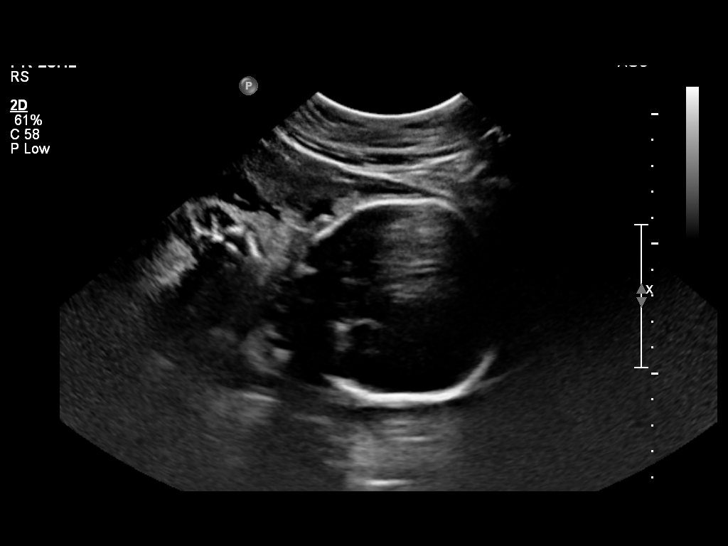
[im 2/17]
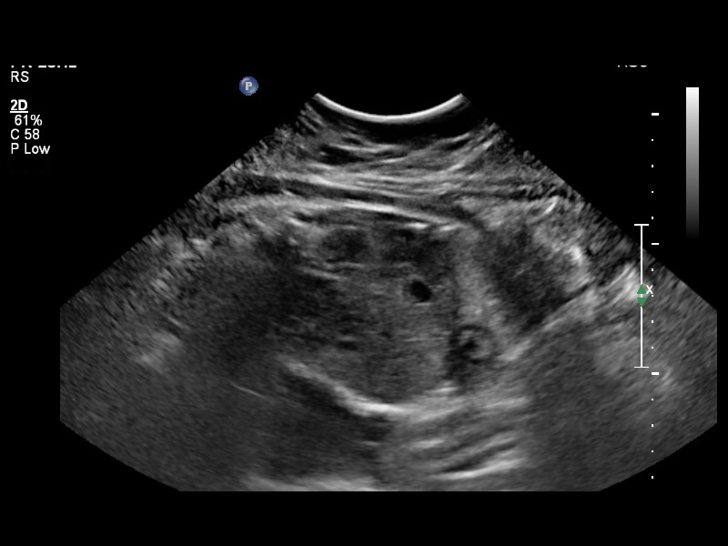
[im 4/17]
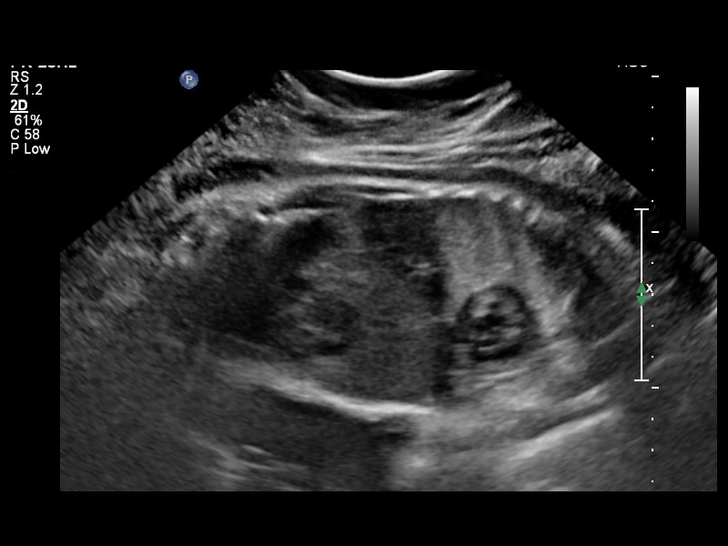
[im 5/17]
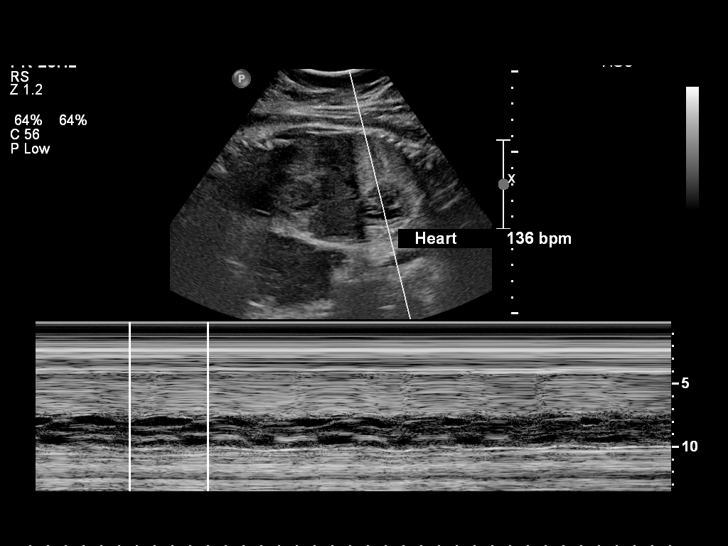
[im 6/17]
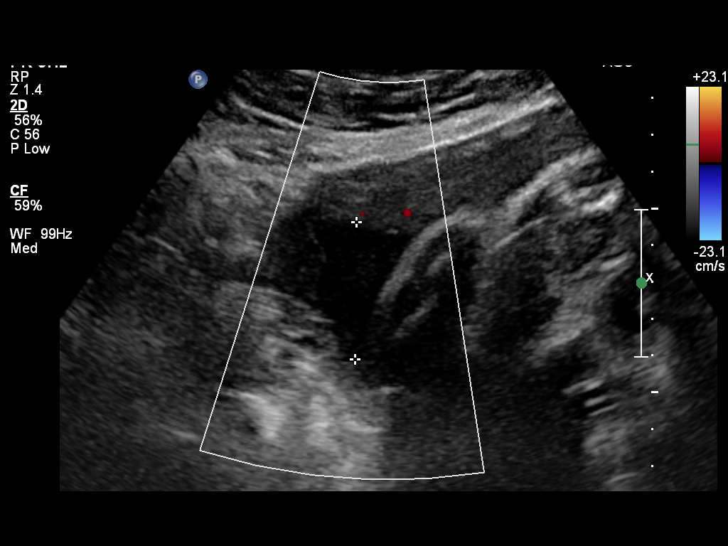
[im 8/17]
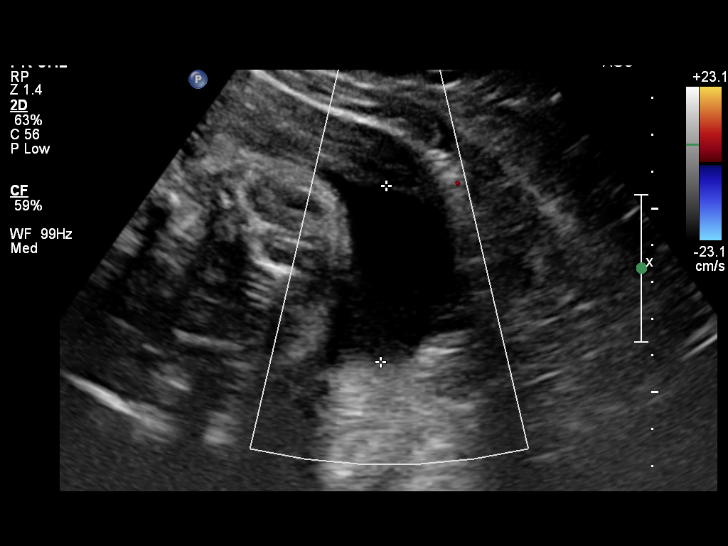
[im 9/17]
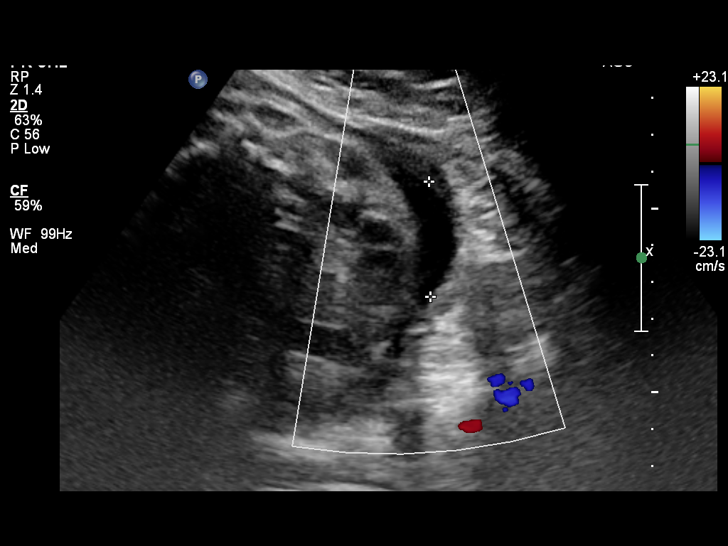
[im 10/17]
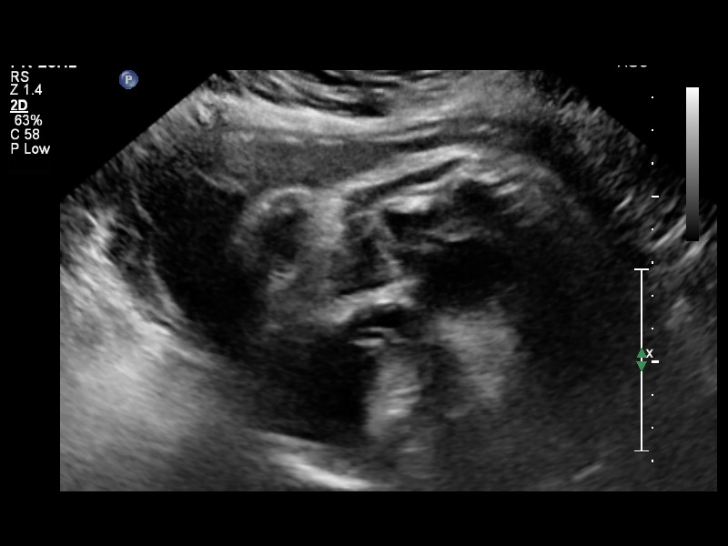
[im 12/17]
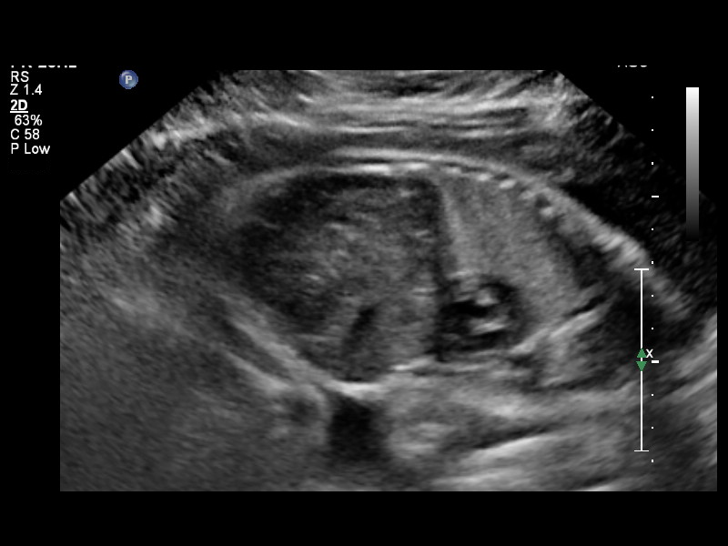
[im 13/17]
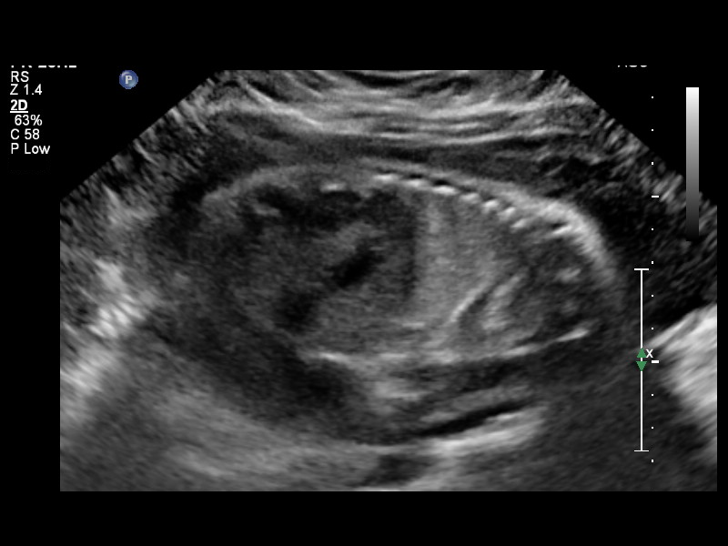
[im 14/17]
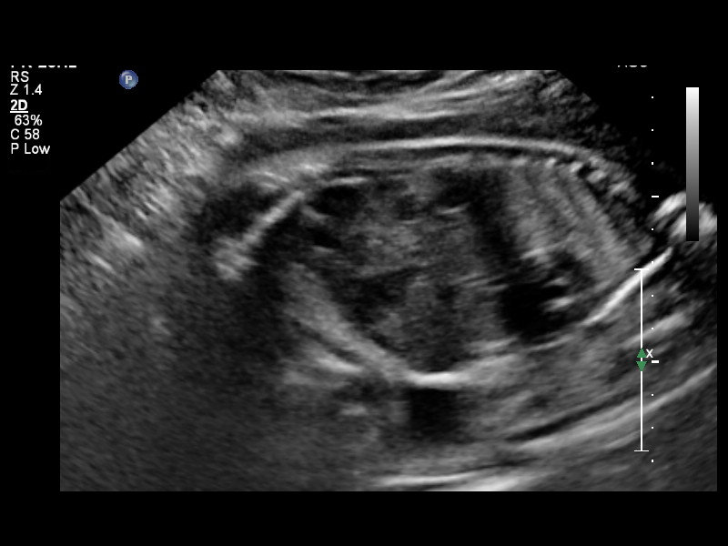
[im 16/17]
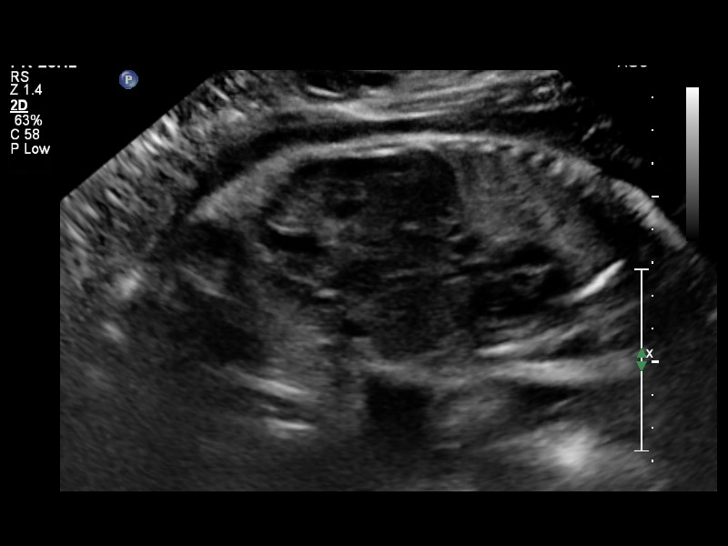
[im 17/17]
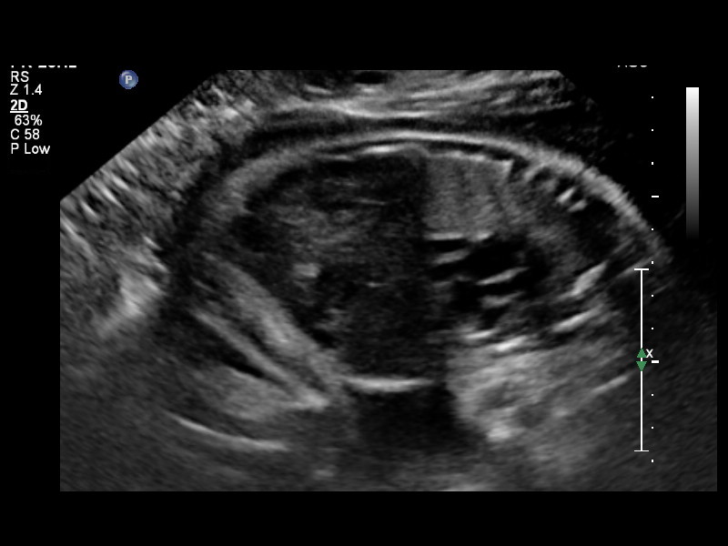

[13 of 17 positions shown; findings below may reference images not displayed]

OBSTETRICS REPORT

Service(s) Provided

Indications

 Size less than dates (Small for gestational age,
 FGR)
 Fetal abnormality - other known or suspected
 (small HC)
 Maternal care for known of suspected poor fetal
 growth, second trimester, not applicable or
 unspecified
 32 weeks gestation of pregnancy
Fetal Evaluation

 Num Of Fetuses:    1
 Fetal Heart Rate:  136                          bpm
 Cardiac Activity:  Observed

 Amniotic Fluid
 AFI FV:      Subjectively within normal limits
 AFI Sum:     14.48   cm       51  %Tile     Larg Pckt:    4.78  cm
 RUQ:   3.72    cm   RLQ:    2.86   cm    LUQ:   3.12    cm   LLQ:    4.78   cm
Biophysical Evaluation

 Amniotic F.V:   Within normal limits       F. Tone:         Observed
 F. Movement:    Observed                   Score:           [DATE]
 F. Breathing:   Observed
Gestational Age

 LMP:           32w 4d        Date:  08/28/13                 EDD:   06/04/14
 Best:          32w 4d     Det. By:  LMP  (08/28/13)          EDD:   06/04/14
Impression

 SIUP at 09w6d (remote interpretation of BPP only)
 BPP [DATE]
Recommendations

 Continue antenatal testing, weekly Doppler, and growth q3-4
 weeks.
 questions or concerns.

## 2016-09-11 ENCOUNTER — Ambulatory Visit: Payer: Self-pay | Admitting: Emergency Medicine

## 2016-09-11 ENCOUNTER — Ambulatory Visit: Payer: Self-pay | Admitting: Family Medicine

## 2016-09-11 NOTE — Progress Notes (Deleted)
   Subjective:    Patient ID: Wendy Fernandez, female    DOB: 11/11/1985, 31 y.o.   MRN: [id number]  HPI  Wendy Fernandez is a 31 year old woman who is here today for a complete physical.   She is a new patient and only prior records available are her admissions during pregnancy and delivery several years ago   Primary Preventative Screenings: Cervical Cancer: Likely done during her pregnancy in 2015 or at 6 week follow-up pelvic post-delivery Family Planning: STI screening: neg HIV 05/2014 Breast Cancer: Colorectal Cancer: Tobacco use: Bone Density: Cardiac: Weight/blood sugar: OTC/vit/supp/herbal: Diet/Exercise/EtOH/substances: Dentist/Optho: Immunizations: Highly likely she had tetanus given one 20/15 during her last pregnancy    Review of Systems     Objective:   Physical Exam        Assessment & Plan:
